# Patient Record
Sex: Male | Born: 1940 | Hispanic: No | Marital: Married | Smoking: Never smoker
Health system: Southern US, Community
[De-identification: ages and names within clinical notes are randomized; demographics above are authoritative.]

## PROBLEM LIST (undated history)

## (undated) DIAGNOSIS — R42 Dizziness and giddiness: Secondary | ICD-10-CM

## (undated) DIAGNOSIS — R519 Headache, unspecified: Secondary | ICD-10-CM

## (undated) DIAGNOSIS — R5383 Other fatigue: Secondary | ICD-10-CM

## (undated) DIAGNOSIS — I639 Cerebral infarction, unspecified: Secondary | ICD-10-CM

## (undated) DIAGNOSIS — R32 Unspecified urinary incontinence: Secondary | ICD-10-CM

## (undated) DIAGNOSIS — E785 Hyperlipidemia, unspecified: Secondary | ICD-10-CM

## (undated) DIAGNOSIS — Z9109 Other allergy status, other than to drugs and biological substances: Secondary | ICD-10-CM

## (undated) DIAGNOSIS — R0602 Shortness of breath: Secondary | ICD-10-CM

## (undated) DIAGNOSIS — J449 Chronic obstructive pulmonary disease, unspecified: Secondary | ICD-10-CM

## (undated) DIAGNOSIS — R296 Repeated falls: Secondary | ICD-10-CM

## (undated) DIAGNOSIS — J45909 Unspecified asthma, uncomplicated: Secondary | ICD-10-CM

## (undated) DIAGNOSIS — I10 Essential (primary) hypertension: Secondary | ICD-10-CM

## (undated) DIAGNOSIS — H538 Other visual disturbances: Secondary | ICD-10-CM

## (undated) DIAGNOSIS — R410 Disorientation, unspecified: Secondary | ICD-10-CM

## (undated) DIAGNOSIS — E119 Type 2 diabetes mellitus without complications: Secondary | ICD-10-CM

## (undated) DIAGNOSIS — R413 Other amnesia: Secondary | ICD-10-CM

## (undated) DIAGNOSIS — F32A Depression, unspecified: Secondary | ICD-10-CM

## (undated) DIAGNOSIS — F329 Major depressive disorder, single episode, unspecified: Secondary | ICD-10-CM

## (undated) DIAGNOSIS — R51 Headache: Secondary | ICD-10-CM

## (undated) HISTORY — DX: Type 2 diabetes mellitus without complications: E11.9

## (undated) HISTORY — DX: Hyperlipidemia, unspecified: E78.5

## (undated) HISTORY — DX: Repeated falls: R29.6

## (undated) HISTORY — DX: Other visual disturbances: H53.8

## (undated) HISTORY — DX: Major depressive disorder, single episode, unspecified: F32.9

## (undated) HISTORY — DX: Depression, unspecified: F32.A

## (undated) HISTORY — DX: Dizziness and giddiness: R42

## (undated) HISTORY — DX: Headache, unspecified: R51.9

## (undated) HISTORY — DX: Other fatigue: R53.83

## (undated) HISTORY — DX: Other amnesia: R41.3

## (undated) HISTORY — DX: Shortness of breath: R06.02

## (undated) HISTORY — DX: Disorientation, unspecified: R41.0

## (undated) HISTORY — DX: Unspecified asthma, uncomplicated: J45.909

## (undated) HISTORY — DX: Unspecified urinary incontinence: R32

## (undated) HISTORY — DX: Chronic obstructive pulmonary disease, unspecified: J44.9

## (undated) HISTORY — DX: Other allergy status, other than to drugs and biological substances: Z91.09

## (undated) HISTORY — DX: Headache: R51

---

## 2001-02-05 ENCOUNTER — Inpatient Hospital Stay (HOSPITAL_COMMUNITY): Admission: EM | Admit: 2001-02-05 | Discharge: 2001-02-06 | Payer: Self-pay

## 2001-02-10 ENCOUNTER — Encounter: Admission: RE | Admit: 2001-02-10 | Discharge: 2001-02-10 | Payer: Self-pay | Admitting: Family Medicine

## 2004-09-21 ENCOUNTER — Ambulatory Visit: Payer: Self-pay | Admitting: Cardiology

## 2004-10-05 ENCOUNTER — Ambulatory Visit: Payer: Self-pay

## 2005-05-17 ENCOUNTER — Ambulatory Visit: Payer: Self-pay | Admitting: Cardiology

## 2005-05-28 ENCOUNTER — Ambulatory Visit: Payer: Self-pay | Admitting: Internal Medicine

## 2005-06-21 ENCOUNTER — Ambulatory Visit: Payer: Self-pay | Admitting: Internal Medicine

## 2006-09-20 ENCOUNTER — Emergency Department (HOSPITAL_COMMUNITY): Admission: EM | Admit: 2006-09-20 | Discharge: 2006-09-20 | Payer: Self-pay | Admitting: Emergency Medicine

## 2009-09-15 ENCOUNTER — Encounter: Admission: RE | Admit: 2009-09-15 | Discharge: 2009-09-15 | Payer: Self-pay | Admitting: Cardiovascular Disease

## 2010-02-23 ENCOUNTER — Ambulatory Visit (HOSPITAL_COMMUNITY)
Admission: RE | Admit: 2010-02-23 | Discharge: 2010-02-23 | Payer: Self-pay | Source: Home / Self Care | Admitting: Family Medicine

## 2010-06-14 DIAGNOSIS — I639 Cerebral infarction, unspecified: Secondary | ICD-10-CM

## 2010-06-14 HISTORY — DX: Cerebral infarction, unspecified: I63.9

## 2010-07-02 ENCOUNTER — Inpatient Hospital Stay (HOSPITAL_COMMUNITY)
Admission: EM | Admit: 2010-07-02 | Discharge: 2010-07-04 | Payer: Self-pay | Source: Home / Self Care | Attending: Neurology | Admitting: Neurology

## 2010-07-03 ENCOUNTER — Encounter (INDEPENDENT_AMBULATORY_CARE_PROVIDER_SITE_OTHER): Payer: Self-pay | Admitting: Neurology

## 2010-07-04 ENCOUNTER — Encounter (INDEPENDENT_AMBULATORY_CARE_PROVIDER_SITE_OTHER): Payer: Self-pay | Admitting: Neurology

## 2010-09-24 LAB — CBC
HCT: 46.4 % (ref 39.0–52.0)
Platelets: 267 10*3/uL (ref 150–400)
RBC: 5.06 MIL/uL (ref 4.22–5.81)
WBC: 9.3 10*3/uL (ref 4.0–10.5)

## 2010-09-24 LAB — COMPREHENSIVE METABOLIC PANEL
BUN: 15 mg/dL (ref 6–23)
CO2: 24 mEq/L (ref 19–32)
Creatinine, Ser: 1 mg/dL (ref 0.4–1.5)
Glucose, Bld: 114 mg/dL — ABNORMAL HIGH (ref 70–99)
Total Bilirubin: 0.5 mg/dL (ref 0.3–1.2)

## 2010-09-24 LAB — DIFFERENTIAL
Basophils Absolute: 0 10*3/uL (ref 0.0–0.1)
Eosinophils Relative: 2 % (ref 0–5)
Lymphs Abs: 2.5 10*3/uL (ref 0.7–4.0)
Monocytes Relative: 11 % (ref 3–12)
Neutro Abs: 5.6 10*3/uL (ref 1.7–7.7)
Neutrophils Relative %: 61 % (ref 43–77)

## 2010-09-24 LAB — OSMOLALITY: Osmolality: 285 mOsm/kg (ref 275–300)

## 2010-09-24 LAB — HEMOGLOBIN A1C
Hgb A1c MFr Bld: 6.1 % — ABNORMAL HIGH (ref <5.7)
Mean Plasma Glucose: 128 mg/dL — ABNORMAL HIGH (ref <117)

## 2010-09-24 LAB — BLOOD GAS, ARTERIAL: FIO2: 21 %

## 2010-09-24 LAB — GLUCOSE, CAPILLARY
Glucose-Capillary: 110 mg/dL — ABNORMAL HIGH (ref 70–99)
Glucose-Capillary: 125 mg/dL — ABNORMAL HIGH (ref 70–99)
Glucose-Capillary: 95 mg/dL (ref 70–99)
Glucose-Capillary: 98 mg/dL (ref 70–99)

## 2010-09-24 LAB — URINALYSIS, ROUTINE W REFLEX MICROSCOPIC
Bilirubin Urine: NEGATIVE
Glucose, UA: NEGATIVE mg/dL
Ketones, ur: NEGATIVE mg/dL
Urobilinogen, UA: 0.2 mg/dL (ref 0.0–1.0)
pH: 6 (ref 5.0–8.0)

## 2010-09-24 LAB — LIPID PANEL
HDL: 30 mg/dL — ABNORMAL LOW (ref >39)
Total CHOL/HDL Ratio: 5.9 RATIO

## 2010-09-24 LAB — CK TOTAL AND CKMB (NOT AT ARMC)
CK, MB: 4.4 ng/mL — ABNORMAL HIGH (ref 0.3–4.0)
Relative Index: 3.1 — ABNORMAL HIGH (ref 0.0–2.5)
Total CK: 140 U/L (ref 7–232)

## 2010-09-24 LAB — ETHANOL: Alcohol, Ethyl (B): <5 mg/dL (ref 0–10)

## 2010-11-30 NOTE — Discharge Summary (Signed)
[place]. [facility]  Patient:    Kurt Lewis, Kurt Lewis                              MRN: [id number] Adm. Date:  [id number] Disc. Date: [id number] Attending:  Tobin Chad Dictator:   Arlis Porta, M.D. CC:         Viviann Spare A. Cleta Alberts, M.D., [facility]   Discharge Summary  DISCHARGE DIAGNOSIS:  Congestive heart failure.  SECONDARY DIAGNOSES: 1. Chronic obstructive pulmonary disease. 2. Hypertension.  PROCEDURES:  The patient had a 2-D echocardiogram done on February 06, 2001, which showed:  1. Left ventricular systolic function at the lower limits of normal with an ejection fraction of 50-55%.  2. Left ventricular wall increase in thickness.  3. Aortic valve increase in thickness.  4. Mild aortic root dilatation.  5. Left ventricular mild to moderate dilatation.  6. Right atrial mild to moderate dilatation.  CONSULTS:  There were no consultations done during his stay.  HISTORY OF PRESENT ILLNESS:  [name] is a 70 year old [place] gentleman who presented to the ER with complaints of fever, coughing, and shortness of breath off and on for the past several years.  On chest x-ray, the patient had some pleural fluid which was unchanged from one month prior when he was seen in the Urgent Care Clinic by Viviann Spare A. Cleta Alberts, M.D.  The patient was then admitted to rule out any myocardial infarction by enzymes, as well as to evaluate his cardiac function by 2-D echocardiogram with the results shown above.  HOSPITAL COURSE:  The patient was treated with IV Lasix with a good diuresis of around 4 L and a decrease in weight of 7-1/2 pounds.  By morning, the patients clinical condition had improved with only having mild crackles at the bases of his lung on auscultation.  The patient had good O2 saturations at 94% on room air.  The patient ruled out for myocardial infarction by enzymes x 2.  It was decided that the patient would benefit from an ACE  inhibitor and hydrochlorothiazide combination pill for treatment of his longstanding anemia and congestive heart failure.  The patient would also likely benefit from a spironolactone as well.  The patient would also benefit from spirometry studies to categorize his COPD.  However, all of this can be done as an outpatient with Viviann Spare A. Cleta Alberts, M.D., at the Urgent Care [facility] on [address]  ACTIVITY INSTRUCTIONS:  The patient can maintain activity as tolerated.  DIET INSTRUCTIONS:  The patient was told to maintain a low-sodium diet, eating less than 2 g of salt per day.  DISCHARGE MEDICATIONS:  The patient was sent home on Uniretic 12.5 mg to 7.5 mg tablets to take one tablet p.o. q.d. one hour before meals, #30, no refills.  FOLLOW-UP PLAN:  The patient is to call Viviann Spare A. Cleta Alberts, M.D., and see him early next week at the Urgent Care [facility] on [address]. The phone number is [phone].  SPECIAL INSTRUCTIONS:  The patient was told to return to the hospital for worsening chest pain and shortness of breath that was not relieved by rest. DD:  02/06/01 TD:  02/07/01 Job: [id number]

## 2010-11-30 NOTE — H&P (Signed)
[name]. [facility]  Patient:    Kurt Lewis, Kurt Lewis                              MRN: [id number] Adm. Date:  [id number] Disc. Date: [id number] Attending:  Tobin Chad Dictator:   Arlis Porta, M.D.                         History and Physical  CHIEF COMPLAINT: Cough, shortness of breath, and fever.  HISTORY OF PRESENT ILLNESS: [name] is a 70 year old [place] gentleman with complaints of cough and shortness of breath.  The patient is not able to relate much history as he speaks very little [place] and his daughter is the only one that can translate.  The patient admits to some fever off and on. Overall, the patient has had these complaints for several years it seems like. The patient was seen in [facility] by Dr. Shea Evans at the end of June 2002, which showed some pleural edema on chest x-ray.  He was told to follow up in the same clinic one month later, at which time another chest x-ray was obtained which did not show any resolution of his pleural fluid.  He was then sent to the ER for evaluation and work-up.  The patient was then therefore admitted for treatment of congestive heart failure.  REVIEW OF SYSTEMS: The patient denies having any nausea, vomiting, or abdominal pain.  The patient also does have some chest pain that is worse with breathing.  PAST MEDICAL HISTORY: COPD.  MEDICATIONS: Advair Diskus.  ALLERGIES: No known drug allergies.  FAMILY HISTORY: The patient has two daughter, ages 58 and 40, and two sons, ages 77 and 4, who are all healthy.  The rest of the family history was unobtainable.  SOCIAL HISTORY: The patient is a former smoker at one pack per day x 60 years. However, the patient quit smoking three to four years ago.  The patient does admit to occasional alcohol use, maybe one time per month.  The patient works at [employer].  The patients family has been in the [place] for only five years.  At some  point the patient was a prisoner of war.  PHYSICAL EXAMINATION:  VITAL SIGNS: Temperature 96.6 degrees, heart rate 63, blood pressure 178/95, respiratory rate 20.  Oxygen saturation 98% on room air.  Weight 162 pounds on admission.  GENERAL: The patient was not in acute distress and was well-appearing.  HEENT: PERRL.  EOMI.  Oropharynx not erythematous, without exudate.  NECK: Soft and supple without any lymphadenopathy or bruits.  The patient did have some JVD.  CHEST: On lung examination the patient had bibasilar crackles up to a third of his lung base, otherwise clear.  The patient did have some good air movement without any use of accessory muscles.  HEART: Regular rate and rhythm without no murmurs, rubs, or gallops.  ABDOMEN: Belly soft, nontender, nondistended, positive bowel sounds.  No signs of hepatosplenomegaly.  EXTREMITIES: Trace edema of the legs, 2+ pulses distally at the radial arteries and dorsalis pedis arteries.  NEUROLOGIC: Grossly intact with no focal abnormalities.  LABORATORY DATA: CBC showed a WBC of 7.7, hemoglobin 14.6, hematocrit 42.2, platelet count 283,000; MCV 85.7.  BMP shows sodium 139, potassium 3.6, chloride 107, CO2 30, BUN 17, creatinine 1.0, glucose 99, calcium 8.7.  LFTs showed AST 27, ALT  21, alkaline phosphatase 66, total bilirubin 0.7, total protein 8.1, albumin 3.8.  Initial cardiac enzymes drawn showed a total CK of 214, MB fraction 3.5, troponin 0.01.  EKG showed T wave inversion in leads V4-V5, which was new compared to EKG done in June 2002.  Chest x-ray shows positive pulmonary edema, tortuous aorta, and cardiomegaly.  ASSESSMENT/PLAN: This is a 70 year old [place] male with shortness of breath, cough, and edema.  1. Congestive heart failure.  The patient shows signs of pulmonary edema     by chest x-ray and crackles on examination, diagnosis also consistent with     his edema of his lower extremities.  The patient will be  given 80 mg IV     x 1 in the emergency department and will be re-evaluated every couple of     hours.  The patient will be scheduled for a 2D echocardiogram in the     morning.  A repeat chest x-ray will also be done in the morning.  The     patient will be further ruled out for any myocardial infarction by     enzymes x 3.  Ins and outs will be followed strictly with daily weights     following.  The patient will likely benefit from an ACE inhibitor and     long-term Aldactone use as an outpatient.  2. Hypertension.  The patients high blood pressure is likely related to his     volume overload and will benefit from diuresis as above in #1.  His blood     pressure will be re-evaluated once he has shown good diuresis.  Again, the     patient would benefit from ACE inhibitor as an outpatient for his blood     pressure control.  3. Chronic obstructive pulmonary disease.  The patient likely needs     spirometry as an outpatient to categorize his COPD given his extensive     tobacco use history. DD:  02/06/01 TD:  02/07/01 Job: [id number]

## 2013-02-25 ENCOUNTER — Inpatient Hospital Stay (HOSPITAL_COMMUNITY)
Admission: EM | Admit: 2013-02-25 | Discharge: 2013-03-01 | DRG: 065 | Disposition: A | Discharge status: Home Health | Payer: Medicare Other | Attending: Neurology | Admitting: Neurology

## 2013-02-25 ENCOUNTER — Emergency Department (HOSPITAL_COMMUNITY): Payer: Medicare Other

## 2013-02-25 ENCOUNTER — Encounter (HOSPITAL_COMMUNITY): Payer: Self-pay | Admitting: *Deleted

## 2013-02-25 DIAGNOSIS — R519 Headache, unspecified: Secondary | ICD-10-CM | POA: Diagnosis present

## 2013-02-25 DIAGNOSIS — I10 Essential (primary) hypertension: Secondary | ICD-10-CM | POA: Diagnosis present

## 2013-02-25 DIAGNOSIS — F3289 Other specified depressive episodes: Secondary | ICD-10-CM | POA: Diagnosis present

## 2013-02-25 DIAGNOSIS — F329 Major depressive disorder, single episode, unspecified: Secondary | ICD-10-CM | POA: Diagnosis present

## 2013-02-25 DIAGNOSIS — R51 Headache: Secondary | ICD-10-CM | POA: Diagnosis present

## 2013-02-25 DIAGNOSIS — Z79899 Other long term (current) drug therapy: Secondary | ICD-10-CM

## 2013-02-25 DIAGNOSIS — E871 Hypo-osmolality and hyponatremia: Secondary | ICD-10-CM | POA: Diagnosis not present

## 2013-02-25 DIAGNOSIS — I610 Nontraumatic intracerebral hemorrhage in hemisphere, subcortical: Secondary | ICD-10-CM | POA: Diagnosis present

## 2013-02-25 DIAGNOSIS — J45909 Unspecified asthma, uncomplicated: Secondary | ICD-10-CM | POA: Diagnosis present

## 2013-02-25 DIAGNOSIS — I69992 Facial weakness following unspecified cerebrovascular disease: Secondary | ICD-10-CM

## 2013-02-25 DIAGNOSIS — E785 Hyperlipidemia, unspecified: Secondary | ICD-10-CM | POA: Diagnosis present

## 2013-02-25 DIAGNOSIS — R410 Disorientation, unspecified: Secondary | ICD-10-CM | POA: Diagnosis present

## 2013-02-25 DIAGNOSIS — I629 Nontraumatic intracranial hemorrhage, unspecified: Secondary | ICD-10-CM | POA: Diagnosis not present

## 2013-02-25 DIAGNOSIS — I619 Nontraumatic intracerebral hemorrhage, unspecified: Principal | ICD-10-CM | POA: Diagnosis present

## 2013-02-25 DIAGNOSIS — Z9114 Patient's other noncompliance with medication regimen: Secondary | ICD-10-CM

## 2013-02-25 DIAGNOSIS — Z91199 Patient's noncompliance with other medical treatment and regimen due to unspecified reason: Secondary | ICD-10-CM

## 2013-02-25 DIAGNOSIS — Z9119 Patient's noncompliance with other medical treatment and regimen: Secondary | ICD-10-CM

## 2013-02-25 DIAGNOSIS — F05 Delirium due to known physiological condition: Secondary | ICD-10-CM | POA: Diagnosis present

## 2013-02-25 HISTORY — DX: Cerebral infarction, unspecified: I63.9

## 2013-02-25 HISTORY — DX: Essential (primary) hypertension: I10

## 2013-02-25 LAB — COMPREHENSIVE METABOLIC PANEL
ALT: 20 U/L (ref 0–53)
Alkaline Phosphatase: 59 U/L (ref 39–117)
BUN: 16 mg/dL (ref 6–23)
CO2: 26 mEq/L (ref 19–32)
Calcium: 9 mg/dL (ref 8.4–10.5)
GFR calc Af Amer: >90 mL/min (ref >90)
GFR calc non Af Amer: 83 mL/min — ABNORMAL LOW (ref >90)
Glucose, Bld: 96 mg/dL (ref 70–99)
Sodium: 136 mEq/L (ref 135–145)

## 2013-02-25 LAB — MRSA PCR SCREENING: MRSA by PCR: NEGATIVE

## 2013-02-25 LAB — CBC WITH DIFFERENTIAL/PLATELET
Eosinophils Absolute: 0.1 10*3/uL (ref 0.0–0.7)
Eosinophils Relative: 1 % (ref 0–5)
HCT: 45.7 % (ref 39.0–52.0)
Hemoglobin: 15.5 g/dL (ref 13.0–17.0)
Lymphocytes Relative: 25 % (ref 12–46)
Lymphs Abs: 2.2 10*3/uL (ref 0.7–4.0)
MCH: 30.4 pg (ref 26.0–34.0)
MCV: 89.6 fL (ref 78.0–100.0)
Monocytes Relative: 8 % (ref 3–12)
Platelets: 263 10*3/uL (ref 150–400)
RBC: 5.1 MIL/uL (ref 4.22–5.81)
WBC: 8.8 10*3/uL (ref 4.0–10.5)

## 2013-02-25 LAB — URINALYSIS, ROUTINE W REFLEX MICROSCOPIC
Bilirubin Urine: NEGATIVE
Glucose, UA: NEGATIVE mg/dL
Ketones, ur: NEGATIVE mg/dL
Protein, ur: NEGATIVE mg/dL
pH: 7 (ref 5.0–8.0)

## 2013-02-25 LAB — GLUCOSE, CAPILLARY: Glucose-Capillary: 91 mg/dL (ref 70–99)

## 2013-02-25 MED ORDER — ACETAMINOPHEN 650 MG RE SUPP: 650.0000 mg | RECTAL | Status: DC | PRN

## 2013-02-25 MED ORDER — LABETALOL HCL 5 MG/ML IV SOLN
10.0000 mg | INTRAVENOUS | Status: DC | PRN
  Administered 2013-02-25: 20 mg via INTRAVENOUS
  Administered 2013-02-27: 10 mg via INTRAVENOUS
  Administered 2013-02-27: 20 mg via INTRAVENOUS
  Filled 2013-02-25 (×3): qty 4

## 2013-02-25 MED ORDER — ACETAMINOPHEN 325 MG PO TABS
650.0000 mg | ORAL_TABLET | ORAL | Status: DC | PRN
  Administered 2013-02-26 – 2013-03-01 (×10): 650 mg via ORAL
  Filled 2013-02-25 (×8): qty 2
  Filled 2013-02-25: qty 1
  Filled 2013-02-25: qty 2

## 2013-02-25 MED ORDER — SENNOSIDES-DOCUSATE SODIUM 8.6-50 MG PO TABS
1.0000 | ORAL_TABLET | Freq: Two times a day (BID) | ORAL | Status: DC
  Administered 2013-02-26 – 2013-02-27 (×3): 1 via ORAL
  Filled 2013-02-25 (×5): qty 1

## 2013-02-25 MED ORDER — PANTOPRAZOLE SODIUM 40 MG IV SOLR
40.0000 mg | Freq: Every day | INTRAVENOUS | Status: DC
  Administered 2013-02-25 – 2013-02-26 (×2): 40 mg via INTRAVENOUS
  Filled 2013-02-25 (×3): qty 40

## 2013-02-25 MED ORDER — SODIUM CHLORIDE 0.9 % IV SOLN
INTRAVENOUS | Status: DC
  Administered 2013-02-25 – 2013-02-26 (×2): via INTRAVENOUS
  Administered 2013-02-27: 1000 mL via INTRAVENOUS

## 2013-02-25 NOTE — Consult Note (Signed)
Referring Physician: ED    Chief Complaint: Confusion, HA, cerebral hemorrhage on CT brain  HPI:                                                                                                                                         Kurt Lewis is an 72 y.o. 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 Patient doesn't speak English and thus his daughter helps to gather all clinical information. He was in his usual state of health until 3 days ago when he was called from [place] to inform him that his granddaughter died unexpectedly and ever since he has been acting confused at home and complaining of HA.   For instance, his daughter reports that Kurt Lewis will get up in the middle of the night to cook and when asked why he is doing that he will not be able to provide a rationale explanation and instead will continue acting confused. At the same time, he has been bothered by daily HA that are not associated with nausea, vomiting, vertigo, double vision, difficulty speaking, slurred speech, language or vision impairment. Importantly, his daughter told me that her father hasn't been taking his BP medication for a long time. No use of anticoagulants or antiplatelets. No recent head trauma or falls. Upon arrival to ED he had a CT brain that disclosed an acute 2.9 x 1.6 cm hematoma centered along the medial aspect of the right thalamus causing local mass effect upon the right lateral ventricle. No intraventricular extension.   Date last known well: uncertain Time last known well: uncertain tPA Given: no, ICH NIHSS: 1 (old deficit right face weakness) MRS: 0   Past Medical History  Diagnosis Date  . Hypertension   . Stroke     History reviewed. No pertinent past surgical history.  History reviewed. No pertinent family history. Social History:  reports that he does not drink  alcohol. His tobacco and drug histories are not on file.  Allergies: No Known Allergies  Medications:                                                                                                                           I have reviewed the patient's current medications.  ROS:                                                                                                                                       History obtained from the patient via daughter's translation, chart review.  General ROS: negative for - chills, fatigue, fever, night sweats, weight gain or weight loss Psychological ROS: negative for - behavioral disorder, hallucinations, memory difficulties, mood swings or suicidal ideation Ophthalmic ROS: negative for - blurry vision, double vision, eye pain or loss of vision ENT ROS: negative for - epistaxis, nasal discharge, oral lesions, sore throat, tinnitus or vertigo Allergy and Immunology ROS: negative for - hives or itchy/watery eyes Hematological and Lymphatic ROS: negative for - bleeding problems, bruising or swollen lymph nodes Endocrine ROS: negative for - galactorrhea, hair pattern changes, polydipsia/polyuria or temperature intolerance Respiratory ROS: negative for - cough, hemoptysis, shortness of breath or wheezing Cardiovascular ROS: negative for - chest pain, dyspnea on exertion, edema or irregular heartbeat Gastrointestinal ROS: negative for - abdominal pain, diarrhea, hematemesis, nausea/vomiting or stool incontinence Genito-Urinary ROS: negative for - dysuria, hematuria, incontinence or urinary frequency/urgency Musculoskeletal ROS: negative for - joint swelling or muscular weakness Neurological ROS: as noted in HPI Dermatological ROS: negative for rash and skin lesion changes    Physical exam: pleasant male in no apparent distress.Blood pressure 172/80, pulse 73, temperature 99.1 F (37.3 C), temperature source Oral, resp. rate 20, SpO2 96.00%.  Head:  normocephalic. Neck: supple, no bruits, no JVD. Cardiac: no murmurs. Lungs: clear. Abdomen: soft, no tender, no mass. Extremities: no edema.  Neurologic Examination:                                                                                                      Mental Status: Alert, awake, oriented x 4, thought content appropriate.  Speech fluent without evidence of aphasia.  Able to follow 3 step commands without difficulty. Cranial Nerves: II: Discs flat bilaterally; Visual fields grossly normal, pupils equal, round, reactive to light and accommodation III,IV, VI: ptosis not present, extra-ocular motions intact bilaterally V: facial light touch sensation normal bilaterally VII: smile symmetric, VIII: hearing normal bilaterally IX,X: gag reflex present XI: bilateral shoulder shrug XII: midline tongue extension Motor: Right : Upper extremity   5/5    Left:     Upper extremity   5/5  Lower extremity   5/5     Lower extremity   5/5 Tone and bulk:normal tone throughout; no atrophy noted Sensory: Pinprick  and light touch intact throughout, bilaterally Deep Tendon Reflexes:  1+ all over  Plantars: Right: downgoing   Left: downgoing Cerebellar: normal finger-to-nose,  normal heel-to-shin test Gait:  No ataxia. CV: pulses palpable throughout     Results for orders placed during the hospital encounter of 02/25/13 (from the past 48 hour(s))  CBC WITH DIFFERENTIAL     Status: None   Collection Time    02/25/13 10:55 AM      Result Value Range   WBC 8.8  4.0 - 10.5 K/uL   RBC 5.10  4.22 - 5.81 MIL/uL   Hemoglobin 15.5  13.0 - 17.0 g/dL   HCT 16.1  09.6 - 04.5 %   MCV 89.6  78.0 - 100.0 fL   MCH 30.4  26.0 - 34.0 pg   MCHC 33.9  30.0 - 36.0 g/dL   RDW 40.9  81.1 - 91.4 %   Platelets 263  150 - 400 K/uL   Neutrophils Relative % 66  43 - 77 %   Neutro Abs 5.8  1.7 - 7.7 K/uL   Lymphocytes Relative 25  12 - 46 %   Lymphs Abs 2.2  0.7 - 4.0 K/uL   Monocytes Relative 8  3 -  12 %   Monocytes Absolute 0.7  0.1 - 1.0 K/uL   Eosinophils Relative 1  0 - 5 %   Eosinophils Absolute 0.1  0.0 - 0.7 K/uL   Basophils Relative 0  0 - 1 %   Basophils Absolute 0.0  0.0 - 0.1 K/uL  COMPREHENSIVE METABOLIC PANEL     Status: Abnormal   Collection Time    02/25/13 10:55 AM      Result Value Range   Sodium 136  135 - 145 mEq/L   Potassium 4.0  3.5 - 5.1 mEq/L   Chloride 101  96 - 112 mEq/L   CO2 26  19 - 32 mEq/L   Glucose, Bld 96  70 - 99 mg/dL   BUN 16  6 - 23 mg/dL   Creatinine, Ser 7.82  0.50 - 1.35 mg/dL   Calcium 9.0  8.4 - 95.6 mg/dL   Total Protein 7.4  6.0 - 8.3 g/dL   Albumin 3.6  3.5 - 5.2 g/dL   AST 22  0 - 37 U/L   ALT 20  0 - 53 U/L   Alkaline Phosphatase 59  39 - 117 U/L   Total Bilirubin 1.1  0.3 - 1.2 mg/dL   GFR calc non Af Amer 83 (*) >90 mL/min   GFR calc Af Amer >90  >90 mL/min   Comment: (NOTE)     The eGFR has been calculated using the CKD EPI equation.     This calculation has not been validated in all clinical situations.     eGFR's persistently <90 mL/min signify possible Chronic Kidney     Disease.  GLUCOSE, CAPILLARY     Status: None   Collection Time    02/25/13 11:06 AM      Result Value Range   Glucose-Capillary 91  70 - 99 mg/dL   Comment 1 Notify RN     Comment 2 Documented in Chart    URINALYSIS, ROUTINE W REFLEX MICROSCOPIC     Status: None   Collection Time    02/25/13 11:23 AM      Result Value Range   Color, Urine YELLOW  YELLOW   APPearance CLEAR  CLEAR   Specific Gravity, Urine 1.016  1.005 -  1.030   pH 7.0  5.0 - 8.0   Glucose, UA NEGATIVE  NEGATIVE mg/dL   Hgb urine dipstick NEGATIVE  NEGATIVE   Bilirubin Urine NEGATIVE  NEGATIVE   Ketones, ur NEGATIVE  NEGATIVE mg/dL   Protein, ur NEGATIVE  NEGATIVE mg/dL   Urobilinogen, UA 1.0  0.0 - 1.0 mg/dL   Nitrite NEGATIVE  NEGATIVE   Leukocytes, UA NEGATIVE  NEGATIVE   Comment: MICROSCOPIC NOT DONE ON URINES WITH NEGATIVE PROTEIN, BLOOD, LEUKOCYTES, NITRITE, OR  GLUCOSE <1000 mg/dL.  POCT I-STAT TROPONIN I     Status: None   Collection Time    02/25/13 11:24 AM      Result Value Range   Troponin i, poc 0.01  0.00 - 0.08 ng/mL   Comment 3            Comment: Due to the release kinetics of cTnI,     a negative result within the first hours     of the onset of symptoms does not rule out     myocardial infarction with certainty.     If myocardial infarction is still suspected,     repeat the test at appropriate intervals.   Ct Head Wo Contrast  02/25/2013   *RADIOLOGY REPORT*  Clinical Data: Severe headaches.  CT HEAD WITHOUT CONTRAST  Technique:  Contiguous axial images were obtained from the base of the skull through the vertex without contrast.  Comparison: 07/03/2010 CT and 07/02/2010 MR.  Findings: Acute to 2.9 x 1.6 cm hematoma centered along the medial aspect of the right thalamus and is causing local mass effect upon the right lateral ventricle without breakthrough into the ventricular system at the current time.  Cause of hemorrhage is indeterminate. This may be related to result of hypertensive bleed.  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 Scattered small cavernomas not entirely excluded.  Currently no hydrocephalus.  No CT evidence of acute thrombotic infarct or intracranial mass lesion separate from the above described findings.  IMPRESSION: Acute to 2.9 x 1.6 cm hematoma centered along the medial aspect of the right thalamus and is causing local mass effect upon the right lateral ventricle.  Please see above.  Critical Value/emergent results were called by telephone at the time of interpretation on 02/25/2013 at 12:34 p.m. to [facility] physician's assistant, who verbally acknowledged these results.   Original Report Authenticated By: Lacy Duverney, M.D.      Assessment: 72 y.o. 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 Mental status is intact and he has no lateralizing findings on exam. Will admit to the stroke service for symptomatic management. Follow CT brain in am. Maintain SBP<160. Frequent neuro-checks.  Stroke Risk Factors - HTN   Wyatt Portela, MD [facility] [phone]  02/25/2013, 1:28 PM

## 2013-02-25 NOTE — Progress Notes (Signed)
Orders per Dr. Leroy Kennedy to hold off on RN stroke swallow screen due to pt. Being npo.  Will continue to monitor pt. And will re-evaluate.

## 2013-02-25 NOTE — ED Provider Notes (Signed)
Medical screening examination/treatment/procedure(s) were performed by non-physician practitioner and as supervising physician I was immediately available for consultation/collaboration.  ICH, VSS, neuro exam w/o deficit.  C/S Neuro w/ plan for admit.  Darlys Gales, MD 02/25/13 [phone]

## 2013-02-25 NOTE — ED Provider Notes (Signed)
CSN: [id number]     Arrival date & time 02/25/13  4098 History     First MD Initiated Contact with Patient 02/25/13 1046     Chief Complaint  Patient presents with  . Altered Mental Status   (Consider location/radiation/quality/duration/timing/severity/associated sxs/prior Treatment) HPI Comments: Patient is a 72 year old male with history of hemorrhagic stroke and hypertension who presents today with sudden onset of altered mental status and confusion. It began on Monday. At his baseline he lives at home with his wife children. He cooks and drives per his daughter. Monday he started becoming forgetful. He generally picks his son up from work, but Monday could not remember where his work was. On Tuesday he could not remember who his son was and brought a cat into the house stating it was his grandson. He still walks with some hesitation which he has done ever since his stroke "a few years ago". He has been complaining of a frontal headache since Tuesday. He has been receiving Tylenol from his daughter. He reports that the Tylenol helps his pain. He had a coughing spell yesterday, but no recent fevers, chills, nausea, vomiting, abdominal pain.  The history is provided by the patient and a relative. A language interpreter was used (family member).    Past Medical History  Diagnosis Date  . Hypertension   . Stroke    History reviewed. No pertinent past surgical history. History reviewed. No pertinent family history. History  Substance Use Topics  . Smoking status: Not on file  . Smokeless tobacco: Not on file  . Alcohol Use: No    Review of Systems  Unable to perform ROS: Mental status change  All other systems reviewed and are negative.    Allergies  Review of patient's allergies indicates no known allergies.  Home Medications   Current Outpatient Rx  Name  Route  Sig  Dispense  Refill  . acetaminophen (TYLENOL) 500 MG tablet   Oral   Take 1,000 mg by mouth daily as needed  (for headache).         . benzonatate (TESSALON) 100 MG capsule   Oral   Take 100 mg by mouth 3 (three) times daily as needed for cough.          BP 172/96  Pulse 77  Temp(Src) 100.5 F (38.1 C) (Oral)  Resp 18  SpO2 93% Physical Exam  Nursing note and vitals reviewed. Constitutional: He is oriented to person, place, and time. He appears well-developed and well-nourished. No distress.  HENT:  Head: Normocephalic and atraumatic.  Right Ear: External ear normal.  Left Ear: External ear normal.  Nose: Nose normal.  Eyes: Conjunctivae are normal.  Neck: Normal range of motion. No tracheal deviation present.  Cardiovascular: Normal rate, regular rhythm and normal heart sounds.   Pulmonary/Chest: Effort normal and breath sounds normal. No stridor.  Abdominal: Soft. He exhibits no distension. There is no tenderness.  Musculoskeletal: Normal range of motion.  Neurological: He is alert and oriented to person, place, and time. He has normal strength. No sensory deficit. Coordination normal.  Strength 5/5 in all extremities. No focal deficit. No pronator drift. No facial droop.   Skin: Skin is warm and dry. He is not diaphoretic.  Psychiatric: He has a normal mood and affect. His behavior is normal.    ED Course   Procedures (including critical care time)  12:52 PM Discussed case with Dr. Leroy Kennedy of neurology who will admit patient.    Labs Reviewed  COMPREHENSIVE METABOLIC PANEL - Abnormal; Notable for the following:    GFR calc non Af Amer 83 (*)    All other components within normal limits  CBC WITH DIFFERENTIAL  GLUCOSE, CAPILLARY  URINALYSIS, ROUTINE W REFLEX MICROSCOPIC  POCT I-STAT TROPONIN I   Ct Head Wo Contrast  02/25/2013   *RADIOLOGY REPORT*  Clinical Data: Severe headaches.  CT HEAD WITHOUT CONTRAST  Technique:  Contiguous axial images were obtained from the base of the skull through the vertex without contrast.  Comparison: 07/03/2010 CT and 07/02/2010 MR.   Findings: Acute to 2.9 x 1.6 cm hematoma centered along the medial aspect of the right thalamus and is causing local mass effect upon the right lateral ventricle without breakthrough into the ventricular system at the current time.  Cause of hemorrhage is indeterminate. This may be related to result of hypertensive bleed.  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 Scattered small cavernomas not entirely excluded.  Currently no hydrocephalus.  No CT evidence of acute thrombotic infarct or intracranial mass lesion separate from the above described findings.  IMPRESSION: Acute to 2.9 x 1.6 cm hematoma centered along the medial aspect of the right thalamus and is causing local mass effect upon the right lateral ventricle.  Please see above.  Critical Value/emergent results were called by telephone at the time of interpretation on 02/25/2013 at 12:34 p.m. to [facility] physician's assistant, who verbally acknowledged these results.   Original Report Authenticated By: Lacy Duverney, M.D.   1. Intracranial hemorrhage   2. Hypertension     MDM  Patient with hx of hemorhagic stroke who presents with increasing confusion. CT shows 2.9 x 1.6 cm hematoma centered along the medial aspect of the right thalamus. Discussed this case with Dr. Leroy Kennedy of Neurology who agrees to admit patient. Vital signs currently stable for transfer to the floor. Discussed plan with daughter who agrees. Dr. Redgie Grayer has evaluated the patient and agrees with plan.   Mora Bellman, PA-C 02/25/13 1346

## 2013-02-25 NOTE — ED Notes (Addendum)
TO ED for eval of confusion over the past wk. Family states pt is repeating daily activity. States he isn't recognizing his family. Pt ambulatory and cooperative. C/o HA since Monday

## 2013-02-26 ENCOUNTER — Other Ambulatory Visit (HOSPITAL_COMMUNITY): Payer: Medicare Other

## 2013-02-26 ENCOUNTER — Inpatient Hospital Stay (HOSPITAL_COMMUNITY): Payer: Medicare Other

## 2013-02-26 MED ORDER — ATORVASTATIN CALCIUM 10 MG PO TABS
10.0000 mg | ORAL_TABLET | Freq: Every day | ORAL | Status: DC
  Administered 2013-02-26 – 2013-02-28 (×3): 10 mg via ORAL
  Filled 2013-02-26 (×4): qty 1

## 2013-02-26 MED ORDER — LISINOPRIL 5 MG PO TABS
5.0000 mg | ORAL_TABLET | Freq: Every day | ORAL | Status: DC
  Administered 2013-02-26 – 2013-03-01 (×4): 5 mg via ORAL
  Filled 2013-02-26 (×4): qty 1

## 2013-02-26 NOTE — Progress Notes (Signed)
Stroke Team Progress Note  HISTORY Kurt Lewis is an 72 y.o. male with a past medical history significant for HTN, stroke few years ago with residual right face weakness, brought to [facility] ED for further evaluation of HA and confusion.  Patient doesn't speak English and thus his daughter helps to gather all clinical information.  He was in his usual state of health until 3 days ago when he was called from [place] to inform him that his granddaughter died unexpectedly and ever since he has been acting confused at home and complaining of HA.  For instance, his daughter reports that [name] will get up in the middle of the night to cook and when asked why he is doing that he will not be able to provide a rationale explanation and instead will continue acting confused. At the same time, he has been bothered by daily HA that are not associated with nausea, vomiting, vertigo, double vision, difficulty speaking, slurred speech, language or vision impairment.  Importantly, his daughter told me that her father hasn't been taking his BP medication for a long time.  No use of anticoagulants or antiplatelets. No recent head trauma or falls.  Upon arrival to ED he had a CT brain that disclosed an acute 2.9 x 1.6 cm hematoma centered along the medial aspect of the right thalamus causing local mass effect upon the right lateral ventricle. No intraventricular extension.   Patient was not a TPA candidate secondary to hemorrhage. He was admitted to the neuro ICU for further evaluation and treatment.  SUBJECTIVE He is lying in bed. Family feels that he is near baseline. Speech appears normal.   OBJECTIVE Most recent Vital Signs: Filed Vitals:   02/26/13 0200 02/26/13 0300 02/26/13 0400 02/26/13 0700  BP: 150/74 140/86 153/74 155/69  Pulse:    69  Temp:   98.5 F (36.9 C)   TempSrc:   Oral   Resp: 16 16 16 16   Height:      Weight:      SpO2: 96%  95% 95%   CBG (last 3)   Recent Labs  02/25/13 1106  GLUCAP 91     IV Fluid Intake:   . sodium chloride 75 mL/hr at 02/26/13 0600    MEDICATIONS  . pantoprazole (PROTONIX) IV  40 mg Intravenous QHS  . senna-docusate  1 tablet Oral BID   PRN:  acetaminophen, acetaminophen, labetalol  Diet:  NPO--await official swallow screen Activity:  Out of bed DVT Prophylaxis:  SCD  CLINICALLY SIGNIFICANT STUDIES Basic Metabolic Panel:  Recent Labs Lab 02/25/13 1055  NA 136  K 4.0  CL 101  CO2 26  GLUCOSE 96  BUN 16  CREATININE 0.92  CALCIUM 9.0   Liver Function Tests:  Recent Labs Lab 02/25/13 1055  AST 22  ALT 20  ALKPHOS 59  BILITOT 1.1  PROT 7.4  ALBUMIN 3.6   CBC:  Recent Labs Lab 02/25/13 1055  WBC 8.8  NEUTROABS 5.8  HGB 15.5  HCT 45.7  MCV 89.6  PLT 263   Coagulation: No results found for this basename: LABPROT, INR,  in the last 168 hours Cardiac Enzymes: No results found for this basename: CKTOTAL, CKMB, CKMBINDEX, TROPONINI,  in the last 168 hours Urinalysis:  Recent Labs Lab 02/25/13 1123  COLORURINE YELLOW  LABSPEC 1.016  PHURINE 7.0  GLUCOSEU NEGATIVE  HGBUR NEGATIVE  BILIRUBINUR NEGATIVE  KETONESUR NEGATIVE  PROTEINUR NEGATIVE  UROBILINOGEN 1.0  NITRITE NEGATIVE  LEUKOCYTESUR NEGATIVE   Lipid  Panel    Component Value Date/Time   CHOL  Value: 178        ATP III CLASSIFICATION:  <200     mg/dL   Desirable  409-811  mg/dL   Borderline High  >=914    mg/dL   High        78/29/5621 0312   TRIG 128 07/04/2010 0312   HDL 30* 07/04/2010 0312   CHOLHDL 5.9 07/04/2010 0312   VLDL 26 07/04/2010 0312   LDLCALC  Value: 122        Total Cholesterol/HDL:CHD Risk Coronary Heart Disease Risk Table                     Men   Women  1/2 Average Risk   3.4   3.3  Average Risk       5.0   4.4  2 X Average Risk   9.6   7.1  3 X Average Risk  23.4   11.0        Use the calculated Patient Ratio above and the CHD Risk Table to determine the patient's CHD Risk.        ATP III CLASSIFICATION (LDL):  <100     mg/dL   Optimal   308-657  mg/dL   Near or Above                    Optimal  130-159  mg/dL   Borderline  846-962  mg/dL   High  >952     mg/dL   Very High* 84/13/2440 0312   HgbA1C  Lab Results  Component Value Date   HGBA1C  Value: 6.1 (NOTE)                                                                       According to the ADA Clinical Practice Recommendations for 2011, when HbA1c is used as a screening test:   >=6.5%   Diagnostic of Diabetes Mellitus           (if abnormal result  is confirmed)  5.7-6.4%   Increased risk of developing Diabetes Mellitus  References:Diagnosis and Classification of Diabetes Mellitus,Diabetes Care,2011,34(Suppl 1):S62-S69 and Standards of Medical Care in         Diabetes - 2011,Diabetes NUUV,2536,64  (Suppl 1):S11-S61.* 07/03/2010    Urine Drug Screen:   No results found for this basename: labopia, cocainscrnur, labbenz, amphetmu, thcu, labbarb    Alcohol Level: No results found for this basename: ETH,  in the last 168 hours  Ct Head Wo Contrast 02/25/2013  Acute to 2.9 x 1.6 cm hematoma centered along the medial aspect of the right thalamus and is causing local mass effect upon the right lateral ventricle.  02/26/2013  Stable hematoma in the right thalamus since yesterday's examination, measured above.  2. Stable mass effect upon the third ventricle and shift of the midline to the left approximating 8 mm  MRI of the brain    MRA of the brain    2D Echocardiogram    Carotid Doppler    CXR    EKG  NSR  Therapy Recommendations   Physical Exam   Pleasant middle aged [place] male not in distress.Awake alert.  Afebrile. Head is nontraumatic. Neck is supple without bruit. Hearing is normal. Cardiac exam no murmur or gallop. Lungs are clear to auscultation. Distal pulses are well felt. Neurological Exam ;  Cognitive evaluation limited due to the language barrier. Patient's son speaks [place] and translates. Patient appears not to be able to recognize family members with  the names but can follow simple one-step commands. He does speak in his native language and does not appear to have dysarthria.. Mild left lower face asymmetry. Tongue midline. No drift. Mild diminished fine finger movements on left. Orbits right over left upper extremity. Mild left grip weak.. Normal sensation . Normal coordination. ;  ASSESSMENT Mr. Dequane Strahan is a 72 y.o. male presenting with headache and acute delirium. Imaging confirms an acute to 2.9 x 1.6 cm hematoma centered along the medial aspect of the right thalamus and is causing local mass effect upon the right lateral ventricle. Infarct felt to be hypertensive in nature. Patient has been on antihypertensives in past but had stopped them for no reason known. (Lisinopril and HCTZ)  On no antithrombotics prior to admission. Now on no antithrombotics for secondary stroke prevention. Patient with resultant transient confusion. Work up underway.   Hypertension  Non compliance  Hyperlipidemia, LDL 122, goal < 100, add statin.  Acute delirium, resolved  Right thalamic hemorrhage   Hospital day # 1  TREATMENT/PLAN  Continue no antithrombotics for secondary stroke prevention.  Repeat CT Scan today--no change  Add statin for hyperlipidemia  Start ACE for hypertension  Risk factor modification  Await therapy evaluations.  Gwendolyn Lima. Manson Passey, [facility], MBA, MHA [facility] Pager: [phone] 02/26/2013 10:18 AM  I have personally obtained a history, examined the patient, evaluated imaging results, and formulated the assessment and plan of care. I agree with the above. Delia Heady, MD

## 2013-02-26 NOTE — Evaluation (Signed)
Physical Therapy Evaluation Patient Details Name: Kurt Lewis MRN: [id number] DOB: 1940-12-25 Today's Date: 02/26/2013 Time: 9562-1308 PT Time Calculation (min): 14 min  PT Assessment / Plan / Recommendation History of Present Illness  He was in his usual state of health until 3 days ago when he was called from [place] to inform him that his granddaughter died unexpectedly and ever since he has been acting confused at home and complaining of HA.  CT brain that disclosed an acute 2.9 x 1.6 cm hematoma centered along the medial aspect of the right thalamus causing local mass effect upon the right lateral ventricle.  Clinical Impression  Pt admitted with above. Pt currently with functional limitations due to the deficits listed below (see PT Problem List).  Pt will benefit from skilled PT to increase their independence and safety with mobility to allow discharge to the venue listed below.       PT Assessment  Patient needs continued PT services    Follow Up Recommendations  Home health PT;Supervision/Assistance - 24 hour    Does the patient have the potential to tolerate intense rehabilitation      Barriers to Discharge        Equipment Recommendations  None recommended by PT    Recommendations for Other Services     Frequency Min 4X/week    Precautions / Restrictions Precautions Precautions: Fall   Pertinent Vitals/Pain VSS      Mobility  Bed Mobility Bed Mobility: Supine to Sit;Sitting - Scoot to Edge of Bed Supine to Sit: 5: Supervision;With rails;HOB elevated Sitting - Scoot to Edge of Bed: 5: Supervision Details for Bed Mobility Assistance: Incr time. Transfers Transfers: Sit to Stand;Stand to Sit Sit to Stand: 5: Supervision;With upper extremity assist;From bed Stand to Sit: 5: Supervision;With upper extremity assist;With armrests;To chair/3-in-1 Ambulation/Gait Ambulation/Gait Assistance: 4: Min guard Ambulation Distance (Feet): 150 Feet Assistive device:  None Ambulation/Gait Assistance Details: Pt without loss of balance.   Gait Pattern: Decreased step length - right;Decreased step length - left;Shuffle Gait velocity: decr    Exercises     PT Diagnosis: Difficulty walking  PT Problem List: Decreased balance;Decreased mobility;Decreased activity tolerance PT Treatment Interventions: Gait training;Functional mobility training;Therapeutic activities;Balance training;Patient/family education     PT Goals(Current goals can be found in the care plan section) Acute Rehab PT Goals Patient Stated Goal: Pt didn't state. PT Goal Formulation: Patient unable to participate in goal setting Time For Goal Achievement: 03/05/13 Potential to Achieve Goals: Good  Visit Information  Last PT Received On: 02/26/13 Assistance Needed: +1 History of Present Illness: He was in his usual state of health until 3 days ago when he was called from [place] to inform him that his granddaughter died unexpectedly and ever since he has been acting confused at home and complaining of HA.  CT brain that disclosed an acute 2.9 x 1.6 cm hematoma centered along the medial aspect of the right thalamus causing local mass effect upon the right lateral ventricle.       Prior Functioning  Home Living Family/patient expects to be discharged to:: Private residence Living Arrangements: Spouse/significant other Available Help at Discharge: Family Home Equipment: None Prior Function Level of Independence: Independent Communication Communication: Prefers language other than English    Cognition  Cognition Arousal/Alertness: Awake/alert Behavior During Therapy: Flat affect Overall Cognitive Status: Difficult to assess Difficult to assess due to: Non-English speaking    Extremity/Trunk Assessment Upper Extremity Assessment Upper Extremity Assessment: Defer to OT evaluation Lower Extremity Assessment Lower  Extremity Assessment: Overall WFL for tasks assessed   Balance  Balance Balance Assessed: Yes Static Standing Balance Static Standing - Balance Support: No upper extremity supported Static Standing - Level of Assistance: 5: Stand by assistance  End of Session PT - End of Session Activity Tolerance: Patient tolerated treatment well Patient left: in chair;with call bell/phone within reach;with family/visitor present Nurse Communication: Mobility status  GP     [facility] 02/26/2013, 2:37 PM  [facility] PT [phone]

## 2013-02-26 NOTE — Progress Notes (Signed)
Pt's daughter at bedside. She states that pt's speech does not sound slurred today. Pt is A,Ox4, but is slow to respond and the daughter confirmed that this is different than his baseline. Daughter also stated that residuals from his previous stroke include a R sided facial droop and shuffling of the right foot, but has no difficulty mobilizing at home.   Kurt Lewis

## 2013-02-26 NOTE — Evaluation (Signed)
Speech Language Pathology Evaluation Patient Details Name: Kurt Lewis MRN: [id number] DOB: 1941/01/29 Today's Date: 02/26/2013 Time: 1191-4782 SLP Time Calculation (min): 50 min  Problem List: There are no active problems to display for this patient.  Past Medical History:  Past Medical History  Diagnosis Date  . Hypertension   . Stroke    Past Surgical History: History reviewed. No pertinent past surgical history. HPI:  He was in his usual state of health until 3 days ago when he was called from [place] to inform him that his granddaughter died unexpectedly and ever since he has been acting confused at home and complaining of HA.  CT brain that disclosed an acute 2.9 x 1.6 cm hematoma centered along the medial aspect of the right thalamus causing local mass effect upon the right lateral ventricle.    Assessment / Plan / Recommendation Clinical Impression   Patient was assessed for speech-language and cognitive functioning with interpreter provided via interpreter phone service. Patient was oriented to time/place situation, stated that he had a h/o "sometimes forget", when asked about his memory. Patient exhibited a mild decreased processing speed when responding to open-ended questions, and decreased thought organization, and a suspected deficit in memory retrieval. Patient also exhibited a mostly flat affect, and suspected decreased emergent awareness, though unable to differentiate from baseline, secondary to patient's spouse present, but unable to accurately judge, and no other family members present at time of evaluation. Patient appears to exhibit a mild impairment of cognitive function, versus exacerbation of premorbid deficits. He will benefit from 24/7 supervision and assistance as needed from family for safety. Do not anticipate a significant benefit from speech-language pathology services at this time.    SLP Assessment  Patient does not need any further Speech Lanaguage Pathology  Services    Follow Up Recommendations  24 hour supervision/assistance  Patient stated that he has asthma, and HBP, and that both of his medications for these conditions, have run out "1-2 months ago".   Frequency and Duration        Pertinent Vitals/Pain    SLP Goals     SLP Evaluation Prior Functioning  Cognitive/Linguistic Baseline: Baseline deficits Baseline deficit details: Via interpreter, patient described that he had h/o "sometimes forget" Available Help at Discharge: Family Vocation: Retired   IT consultant  Overall Cognitive Status: Difficult to assess Arousal/Alertness: Awake/alert Orientation Level: Oriented X4 Attention: Sustained;Selective Sustained Attention: Appears intact Selective Attention: Impaired Selective Attention Impairment: Verbal complex Memory: Impaired Memory Impairment: Retrieval deficit Awareness: Impaired Awareness Impairment: Emergent impairment Problem Solving: Appears intact Executive Function: Reasoning Reasoning: Impaired Reasoning Impairment: Verbal complex Behaviors: Other (comment) (mostly flat affect) Safety/Judgment: Impaired    Comprehension  Auditory Comprehension Overall Auditory Comprehension: Appears within functional limits for tasks assessed Conversation: Complex Interfering Components: Processing speed;Attention EffectiveTechniques: Extra processing time;Repetition Visual Recognition/Discrimination Discrimination: Not tested Reading Comprehension Reading Status: Not tested    Expression Expression Primary Mode of Expression: Verbal (interpreter utilized via phone ) Verbal Expression Overall Verbal Expression: Appears within functional limits for tasks assessed   Oral / Motor Oral Motor/Sensory Function Overall Oral Motor/Sensory Function: Appears within functional limits for tasks assessed Motor Speech Overall Motor Speech: Appears within functional limits for tasks assessed   GO     Pablo Lawrence 02/26/2013, 3:56 PM

## 2013-02-26 NOTE — Progress Notes (Signed)
UR completed 

## 2013-02-27 ENCOUNTER — Inpatient Hospital Stay (HOSPITAL_COMMUNITY): Payer: Medicare Other

## 2013-02-27 DIAGNOSIS — R410 Disorientation, unspecified: Secondary | ICD-10-CM | POA: Diagnosis present

## 2013-02-27 DIAGNOSIS — E785 Hyperlipidemia, unspecified: Secondary | ICD-10-CM | POA: Diagnosis present

## 2013-02-27 DIAGNOSIS — Z91148 Patient's other noncompliance with medication regimen for other reason: Secondary | ICD-10-CM

## 2013-02-27 DIAGNOSIS — I629 Nontraumatic intracranial hemorrhage, unspecified: Secondary | ICD-10-CM | POA: Diagnosis not present

## 2013-02-27 DIAGNOSIS — J45909 Unspecified asthma, uncomplicated: Secondary | ICD-10-CM | POA: Diagnosis present

## 2013-02-27 DIAGNOSIS — Z9114 Patient's other noncompliance with medication regimen: Secondary | ICD-10-CM

## 2013-02-27 DIAGNOSIS — I10 Essential (primary) hypertension: Secondary | ICD-10-CM | POA: Diagnosis present

## 2013-02-27 MED ORDER — ATORVASTATIN CALCIUM 10 MG PO TABS: 10.0000 mg | ORAL_TABLET | Freq: Every day | ORAL | Status: AC

## 2013-02-27 MED ORDER — ALBUTEROL SULFATE HFA 108 (90 BASE) MCG/ACT IN AERS
2.0000 | INHALATION_SPRAY | Freq: Four times a day (QID) | RESPIRATORY_TRACT | Status: DC | PRN
  Filled 2013-02-27: qty 6.7

## 2013-02-27 MED ORDER — ONDANSETRON HCL 4 MG/2ML IJ SOLN
4.0000 mg | Freq: Once | INTRAMUSCULAR | Status: AC
  Administered 2013-02-27: 4 mg via INTRAVENOUS
  Filled 2013-02-27: qty 2

## 2013-02-27 MED ORDER — KETOROLAC TROMETHAMINE 15 MG/ML IJ SOLN
15.0000 mg | Freq: Three times a day (TID) | INTRAMUSCULAR | Status: DC
  Administered 2013-02-27: 15 mg via INTRAVENOUS
  Filled 2013-02-27 (×4): qty 1

## 2013-02-27 MED ORDER — PANTOPRAZOLE SODIUM 40 MG PO TBEC: 40.0000 mg | DELAYED_RELEASE_TABLET | Freq: Every day | ORAL | Status: DC

## 2013-02-27 MED ORDER — ALBUTEROL SULFATE HFA 108 (90 BASE) MCG/ACT IN AERS: 2.0000 | INHALATION_SPRAY | Freq: Four times a day (QID) | RESPIRATORY_TRACT | Status: AC | PRN

## 2013-02-27 MED ORDER — KETOROLAC TROMETHAMINE 30 MG/ML IJ SOLN
INTRAMUSCULAR | Status: AC
  Administered 2013-02-27: 30 mg
  Filled 2013-02-27: qty 1

## 2013-02-27 MED ORDER — LISINOPRIL 5 MG PO TABS: 5.0000 mg | ORAL_TABLET | Freq: Every day | ORAL | Status: DC

## 2013-02-27 NOTE — Progress Notes (Signed)
Stroke Team Progress Note  HISTORY Kurt Lewis is a 72 y.o. male with a past medical history significant for HTN, stroke few years ago with residual right face weakness, brought to  [facility] ED for further evaluation of HA and confusion on 02/25/2013. Patient doesn't speak English and thus his daughter helped to gather all clinical information.  He was in his usual state of health until 3 days prior to admission when he received a call from Kurt Lewis to inform him that his granddaughter died unexpectedly and ever since he had been acting confused at home and complaining of HA.  For instance, his daughter reported that Kurt Lewis would get up in the middle of the night to cook and when asked what he was doing he was not be able to provide a rationale explanation and instead would continue acting confused. At the same time, he had been bothered by daily HAs that were not associated with nausea, vomiting, vertigo, double vision, difficulty speaking, slurred speech, language or vision impairment. Importantly, his daughter told me that her father had not been taking his BP medication for a long time. No use of anticoagulants or antiplatelets. No recent head trauma or falls.  Upon arrival to ED he had a CT brain that disclosed an acute 2.9 x 1.6 cm hematoma centered along the medial aspect of the right thalamus causing local mass effect upon the right lateral ventricle. No intraventricular extension.  Patient was not a TPA candidate secondary to hemorrhage. He was admitted to the neuro ICU for further evaluation and treatment.  SUBJECTIVE The patient's daughter is present this morning. The patient speaks limited [place]. The family understands that the patient must be compliant with his medications.   OBJECTIVE Most recent Vital Signs: Filed Vitals:   02/26/13 0200 02/26/13 0300 02/26/13 0400 02/26/13 0700  BP: 150/74 140/86 153/74 155/69  Pulse:    69  Temp:   98.5 F (36.9 C)   TempSrc:   Oral   Resp: 16 16 16 16    Height:      Weight:      SpO2: 96%  95% 95%   CBG (last 3)   Recent Labs  02/25/13 1106  GLUCAP 91    IV Fluid Intake:   . sodium chloride 75 mL/hr at 02/26/13 0600    MEDICATIONS  . pantoprazole (PROTONIX) IV  40 mg Intravenous QHS  . senna-docusate  1 tablet Oral BID   PRN:  acetaminophen, acetaminophen, labetalol  Diet:  Heart healthy diet with thin liquids. Activity:  Out of bed DVT Prophylaxis:  SCD  CLINICALLY SIGNIFICANT STUDIES Basic Metabolic Panel:  Recent Labs Lab 02/25/13 1055  NA 136  K 4.0  CL 101  CO2 26  GLUCOSE 96  BUN 16  CREATININE 0.92  CALCIUM 9.0   Liver Function Tests:  Recent Labs Lab 02/25/13 1055  AST 22  ALT 20  ALKPHOS 59  BILITOT 1.1  PROT 7.4  ALBUMIN 3.6   CBC:  Recent Labs Lab 02/25/13 1055  WBC 8.8  NEUTROABS 5.8  HGB 15.5  HCT 45.7  MCV 89.6  PLT 263   Coagulation: No results found for this basename: LABPROT, INR,  in the last 168 hours Cardiac Enzymes: No results found for this basename: CKTOTAL, CKMB, CKMBINDEX, TROPONINI,  in the last 168 hours Urinalysis:  Recent Labs Lab 02/25/13 1123  COLORURINE YELLOW  LABSPEC 1.016  PHURINE 7.0  GLUCOSEU NEGATIVE  HGBUR NEGATIVE  BILIRUBINUR NEGATIVE  KETONESUR NEGATIVE  PROTEINUR NEGATIVE  UROBILINOGEN 1.0  NITRITE NEGATIVE  LEUKOCYTESUR NEGATIVE   Lipid Panel    Component Value Date/Time   CHOL  Value: 178        ATP III CLASSIFICATION:  <200     mg/dL   Desirable  161-096  mg/dL   Borderline High  >=045    mg/dL   High        40/98/1191 0312   TRIG 128 07/04/2010 0312   HDL 30* 07/04/2010 0312   CHOLHDL 5.9 07/04/2010 0312   VLDL 26 07/04/2010 0312   LDLCALC  Value: 122        Total Cholesterol/HDL:CHD Risk Coronary Heart Disease Risk Table                     Men   Women  1/2 Average Risk   3.4   3.3  Average Risk       5.0   4.4  2 X Average Risk   9.6   7.1  3 X Average Risk  23.4   11.0        Use the calculated Patient Ratio above and the CHD  Risk Table to determine the patient's CHD Risk.        ATP III CLASSIFICATION (LDL):  <100     mg/dL   Optimal  478-295  mg/dL   Near or Above                    Optimal  130-159  mg/dL   Borderline  621-308  mg/dL   High  >657     mg/dL   Very High* 84/69/6295 0312   HgbA1C  Lab Results  Component Value Date   HGBA1C  Value: 6.1 (NOTE)                                                                       According to the ADA Clinical Practice Recommendations for 2011, when HbA1c is used as a screening test:   >=6.5%   Diagnostic of Diabetes Mellitus           (if abnormal result  is confirmed)  5.7-6.4%   Increased risk of developing Diabetes Mellitus  References:Diagnosis and Classification of Diabetes Mellitus,Diabetes Care,2011,34(Suppl 1):S62-S69 and Standards of Medical Care in         Diabetes - 2011,Diabetes MWUX,3244,01  (Suppl 1):S11-S61.* 07/03/2010    Urine Drug Screen:   No results found for this basename: labopia, cocainscrnur, labbenz, amphetmu, thcu, labbarb    Alcohol Level: No results found for this basename: ETH,  in the last 168 hours  Ct Head Wo Contrast 02/25/2013  Acute to 2.9 x 1.6 cm hematoma centered along the medial aspect of the right thalamus and is causing local mass effect upon the right lateral ventricle.  02/26/2013  Stable hematoma in the right thalamus since yesterday's examination, measured above.  2. Stable mass effect upon the third ventricle and shift of the midline to the left approximating 8 mm  02/27/2013 Stable appearance of right thalamic hemorrhage with associated 7 mm  of right-to-left midline shift. No hydrocephalous.  2D Echocardiogram  not indicated  Carotid Doppler  not indicated  CXR    EKG  NSR  Therapy Recommendations home health therapies with 24-hour per day supervision.  Physical Exam   Pleasant middle aged [place] male not in distress.Awake alert. Afebrile. Head is nontraumatic. Neck is supple without bruit. Hearing is  normal. Neurological Exam ;  Cognitive evaluation limited due to the language barrier. Patient can follow simple one-step commands.  Mild left lower face asymmetry. Tongue midline. Mild left upper extremity drift otherwise strength is nearly symmetrical. Mild diminished fine finger movements on left. Normal sensation . Normal coordination.    ASSESSMENT Kurt Lewis is a 72 y.o. male presenting with headache and acute delirium. Imaging confirms an acute to 2.9 x 1.6 cm hematoma centered along the medial aspect of the right thalamus and is causing local mass effect upon the right lateral ventricle. Infarct felt to be hypertensive in nature. Patient has been on antihypertensives in past but had stopped them for unknown reasons. He had been on Lisinopril and HCTZ.  On no antithrombotics prior to admission. Now on no antithrombotics for secondary stroke prevention. Patient with resultant transient confusion. Work up completed.   Hypertension  Non compliance  Hyperlipidemia, LDL 122, goal < 100, now on Lipitor 10 mg daily.  Acute delirium, resolved  Right thalamic hemorrhage   Hospital day # 1  TREATMENT/PLAN  Continue no antithrombotics for secondary stroke prevention secondary to bleed.  Lipitor for hyperlipidemia.  Lisinopril added for hypertension.  Albuterol metered-dose inhaler as needed for asthma.  Risk factor modification.  Home health therapies with 24-hour per day supervision recommended.  Plan discharge today to follow up with Dr. Pearlean Brownie in 2 months.  Delton See PA-C [facility] Pager [phone] 02/27/2013, 8:08 AM   I have personally obtained a history, examined the patient, evaluated imaging results, and formulated the assessment and plan of care. I agree with the above.  Pt's family concerned for possibility of falls at home. Will re-eval with PT/OT. Pauletta Browns

## 2013-02-27 NOTE — Progress Notes (Signed)
The plan was to proceed with discharge today and to have home health therapies with 24-hour per day supervision. The patient's nurse contacted me after discussing the situation with the patient's daughter. The family does not feel comfortable taking the patient home at this time. They are concerned that he had had several falls prior to admission and they feel that he might fall and injure himself after discharge. I will cancel the discharge for today and ask the physical and occupational therapists to reevaluate the patient and to work with the family regarding their care issue concerns. Inpatient rehabilitation might also be a consideration if felt to be indicated by the therapists.  Delton See PA-C [facility] Pager [phone] 02/27/2013, 2:09 PM

## 2013-02-27 NOTE — Progress Notes (Signed)
Pt called to use restroom. Spit up small amounts of water. Said yes when asked if he felt nauseous. BP taken. Elevated. PRN medications given x2. Dr. Thad Ranger notified. Stat CT ordered. Will accompany patient to CT. BP now within ordered range.

## 2013-02-27 NOTE — Evaluation (Signed)
Occupational Therapy Evaluation Patient Details Name: Kurt Lewis MRN: [id number] DOB: 1940-09-07 Today's Date: 02/27/2013 Time: 7829-5621 OT Time Calculation (min): 42 min  OT Assessment / Plan / Recommendation History of present illness He was in his usual state of health until 3 days ago when he was called from [place] to inform him that his granddaughter died unexpectedly and ever since he has been acting confused at home and complaining of HA.  CT brain that disclosed an acute 2.9 x 1.6 cm hematoma centered along the medial aspect of the right thalamus causing local mass effect upon the right lateral ventricle.   Clinical Impression   PT admitted with confusion and HA. Pt currently with functional limitiations due to the deficits listed below (see OT problem list).CT (+) 7 mm above right-to-left midline shift of the Right Thalamus Pt will benefit from skilled OT to increase their independence and safety with adls and balance to allow discharge CIR. Pt with balance and cognition deficits. Pt is not at baseline currently and high fall risk.     OT Assessment  Patient needs continued OT Services    Follow Up Recommendations  Supervision/Assistance - 24 hour;CIR (pt's family agreeable to community rehab if required)    Barriers to Discharge      Equipment Recommendations  3 in 1 bedside comode;Other (comment) (RW)    Recommendations for Other Services    Frequency  Min 3X/week    Precautions / Restrictions Precautions Precautions: Fall   Pertinent Vitals/Pain HA Dizziness 5 out 10     ADL  Eating/Feeding: Set up Where Assessed - Eating/Feeding: Chair Grooming: Teeth care;Min guard Where Assessed - Grooming: Supported standing (required tooth paste applied to brush) Toilet Transfer: Minimal assistance Toilet Transfer Method: Sit to Barista: Regular height toilet;Grab bars Toileting - Clothing Manipulation and Hygiene: Maximal assistance Where Assessed  - Engineer, mining and Hygiene: Sit to stand from 3-in-1 or toilet Equipment Used: Gait belt Transfers/Ambulation Related to ADLs: Pt required (A) for bed mobility adn unable to complete supine<> sit eob . pt with LOB at EOB x3. Pt reports dizziness. Pt reports HA in frontal area. Pt with right gaze preference but able to scan to the left with cues. Pt ambulated holding onto bed and environmental surfaces. Pt attempting to push IV pole. Pt unabel to sustain static standing with eyes closed for 10 seconds. pt opening eyes and losing balance posteriorly. pt with decr speed her daughter report. Pt required stopping to communicate. Pt could not walk and talk to daughter ADL Comments: Pt supine on arrival. pt aroused with tactile input. Pt aroused spoke and then falling back to sleep. Pt again aroused and asked to progress to eob. Pt required extended time and (A). Pt ambulated to bathroom and noted wet feel to gown. Pt asked if gown was wet and pt responds yes. Pt unaware prior to gown wetness. Pt completed hand hygiene and needed vc from daughter to locate paper towels on left side. pt turing head to locate. Pt told to walk backwards. pt taking two steps and then terminating task by turning unsteady 180 degrees to walk in the reverse direction. pt given 3 step command "touch your nose  clap your hands and touch your ear" Pt touched toes  and ear. Pt demonstrates some receptive issues ( daughter repeating all commands to translate so patient hearing all sequence commands twice)    OT Diagnosis: Generalized weakness;Cognitive deficits;Disturbance of vision  OT Problem List: Decreased strength;Decreased  activity tolerance;Impaired balance (sitting and/or standing);Impaired vision/perception;Decreased cognition;Decreased knowledge of use of DME or AE;Decreased safety awareness;Decreased knowledge of precautions;Obesity;Pain OT Treatment Interventions: Self-care/ADL training;Neuromuscular education;DME  and/or AE instruction;Therapeutic activities;Cognitive remediation/compensation;Visual/perceptual remediation/compensation;Patient/family education;Balance training   OT Goals(Current goals can be found in the care plan section) Acute Rehab OT Goals Patient Stated Goal: Pt didn't state. OT Goal Formulation: With patient/family Time For Goal Achievement: 03/13/13 Potential to Achieve Goals: Good  Visit Information  Last OT Received On: 02/27/13 Assistance Needed: +1 History of Present Illness: He was in his usual state of health until 3 days ago when he was called from [place] to inform him that his granddaughter died unexpectedly and ever since he has been acting confused at home and complaining of HA.  CT brain that disclosed an acute 2.9 x 1.6 cm hematoma centered along the medial aspect of the right thalamus causing local mass effect upon the right lateral ventricle.       Prior Functioning     Home Living Family/patient expects to be discharged to:: Private residence Living Arrangements: Spouse/significant other Available Help at Discharge: Family Home Equipment: None Prior Function Level of Independence: Independent Communication Communication: Prefers language other than English Dominant Hand: Right         Vision/Perception Vision - History Baseline Vision: Wears glasses all the time Vision - Assessment Vision Assessment: Vision not tested Additional Comments: vision was not formally tested but pt demonstrates some concerns during session and vision needs to be further assessed for left visual fields.    Cognition  Cognition Arousal/Alertness: Awake/alert Behavior During Therapy: Flat affect Overall Cognitive Status: Impaired/Different from baseline Area of Impairment: Orientation;Attention;Memory;Following commands;Safety/judgement Current Attention Level: Sustained Memory: Decreased recall of precautions;Decreased short-term memory Following Commands: Follows  one step commands with increased time;Follows multi-step commands inconsistently Safety/Judgement: Decreased awareness of safety;Decreased awareness of deficits General Comments: Pts daughter present that speaks english and patient understands simple english. pt required incr time for one step commands, pt needed repetition of 2 step commands to complete and unable to recall follow 3 step commands. Pt s daughter repeating 2 step commands.. informed family only to state command once and stop. Pt with delayed response and stopping due to poor recall. Pt reports having trouble understanding daughter and trouble remembering Difficult to assess due to: Non-English speaking    Extremity/Trunk Assessment Upper Extremity Assessment Upper Extremity Assessment: Overall WFL for tasks assessed Lower Extremity Assessment Lower Extremity Assessment: Defer to PT evaluation Cervical / Trunk Assessment Cervical / Trunk Assessment: Kyphotic     Mobility Bed Mobility Bed Mobility: Rolling Right;Right Sidelying to Sit;Sitting - Scoot to [place] of Bed Rolling Right: 4: Min guard (extended time and effort) Right Sidelying to Sit: 4: Min guard;HOB flat (extended time and effort with LOB posteriorly) Sitting - Scoot to Edge of Bed: 3: Mod assist Details for Bed Mobility Assistance: pt supine and pulling on mattress/ sheets to help roll onto right elbow. Pt pushing up onto hands and posterior lean. pt long sitting attempting to progress to EOB sitting. pt became stuck with bil UE elbow extension and unable to sustain. Pt posterior lob x3. therapist educated daughter on progress to EOB using pad and weight shift. Pt followign commands to weight shift. Pt at EOB reports dizziness. Pt demonstrates deficits understanding daughters command to progress to bil LE touching the floor. Transfers Transfers: Sit to Stand;Stand to Sit Sit to Stand: 4: Min assist;With upper extremity assist;From bed (posterior lean x2 attempts to  achieve standing) Stand  to Sit: 4: Min assist;With upper extremity assist;To toilet Details for Transfer Assistance: cues for safety and pt grabbing for environmental help     Exercise     Balance Static Standing Balance Static Standing - Balance Support: Bilateral upper extremity supported;During functional activity Static Standing - Level of Assistance: 4: Min assist Standardized Balance Assessment Standardized Balance Assessment: Berg Balance Test Berg Balance Test Sit to Stand: Able to stand using hands after several tries Standing Unsupported: Needs several tries to stand 30 seconds unsupported Sitting with Back Unsupported but Feet Supported on Floor or Stool: Able to sit 10 seconds Stand to Sit: Sits independently, has uncontrolled descent Transfers: Needs one person to assist Standing Unsupported with Eyes Closed: Unable to keep eyes closed 3 seconds but stays steady Turn 360 Degrees: Needs assistance while turning High Level Balance High Level Balance Activites: Backward walking;Turns High Level Balance Comments: pt demonstrates high fall risk   End of Session OT - End of Session Activity Tolerance: Patient limited by fatigue;Patient limited by pain Patient left: in chair;with call bell/phone within reach;with family/visitor present Nurse Communication: Mobility status;Precautions  GO     Lucile Shutters 02/27/2013, 2:26 PM Pager: [phone]

## 2013-02-28 ENCOUNTER — Inpatient Hospital Stay (HOSPITAL_COMMUNITY): Payer: Medicare Other

## 2013-02-28 LAB — URINALYSIS, ROUTINE W REFLEX MICROSCOPIC
Glucose, UA: 100 mg/dL — AB
Ketones, ur: NEGATIVE mg/dL
Leukocytes, UA: NEGATIVE
Protein, ur: NEGATIVE mg/dL

## 2013-02-28 LAB — CBC WITH DIFFERENTIAL/PLATELET
Basophils Absolute: 0 10*3/uL (ref 0.0–0.1)
Basophils Relative: 0 % (ref 0–1)
Eosinophils Relative: 0 % (ref 0–5)
HCT: 43.7 % (ref 39.0–52.0)
Lymphocytes Relative: 13 % (ref 12–46)
MCH: 31.5 pg (ref 26.0–34.0)
MCHC: 35 g/dL (ref 30.0–36.0)
MCV: 90.1 fL (ref 78.0–100.0)
Monocytes Absolute: 0.9 10*3/uL (ref 0.1–1.0)
RDW: 12.9 % (ref 11.5–15.5)

## 2013-02-28 LAB — BASIC METABOLIC PANEL
BUN: 20 mg/dL (ref 6–23)
CO2: 23 mEq/L (ref 19–32)
CO2: 25 mEq/L (ref 19–32)
Calcium: 8.7 mg/dL (ref 8.4–10.5)
Calcium: 8.6 mg/dL (ref 8.4–10.5)
Creatinine, Ser: 0.93 mg/dL (ref 0.50–1.35)
Creatinine, Ser: 0.99 mg/dL (ref 0.50–1.35)
Glucose, Bld: 121 mg/dL — ABNORMAL HIGH (ref 70–99)

## 2013-02-28 MED ORDER — WHITE PETROLATUM GEL
Status: AC
  Administered 2013-02-28: 21:00:00
  Filled 2013-02-28: qty 5

## 2013-02-28 MED ORDER — SERTRALINE HCL 25 MG PO TABS
25.0000 mg | ORAL_TABLET | Freq: Every day | ORAL | Status: DC
  Administered 2013-02-28 – 2013-03-01 (×2): 25 mg via ORAL
  Filled 2013-02-28 (×3): qty 1

## 2013-02-28 MED ORDER — ENSURE COMPLETE PO LIQD
237.0000 mL | Freq: Three times a day (TID) | ORAL | Status: DC
  Administered 2013-02-28 – 2013-03-01 (×4): 237 mL via ORAL

## 2013-02-28 NOTE — Progress Notes (Signed)
Stroke Team Progress Note  HISTORY Kurt Lewis is a 72 y.o. 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 Patient doesn't speak English and thus his daughter helped to gather all clinical information.  He was in his usual state of health until 3 days prior to admission when he received a call from [place] to inform him that his granddaughter died unexpectedly and ever since he had been acting confused at home and complaining of HA.  For instance, his daughter reported that Kurt Lewis would get up in the middle of the night to cook and when asked what he was doing he was not be able to provide a rationale explanation and instead would continue acting confused. At the same time, he had been bothered by daily HAs that were not associated with nausea, vomiting, vertigo, double vision, difficulty speaking, slurred speech, language or vision impairment. Importantly, his daughter told me that her father had not been taking his BP medication for a long time. No use of anticoagulants or antiplatelets. No recent head trauma or falls.  Upon arrival to ED he had a CT brain that disclosed an acute 2.9 x 1.6 cm hematoma centered along the medial aspect of the right thalamus causing local mass effect upon the right lateral ventricle. No intraventricular extension.  Patient was not a TPA candidate secondary to hemorrhage. He was admitted to the neuro ICU for further evaluation and treatment.  SUBJECTIVE  Nursing reports the patient had nausea during the night and received Zofran. Temperature this morning 100.6. Question etiology.   The patient's daughter is at the bedside this morning. She is a Engineer, civil (consulting) at [employer]. Dr Loretha Brasil discussed with her the family's concerns about taking the patient home. They also discussed the possibility of inpatient rehabilitation. The daughter did  not feel that her father would do well in that setting. She prefers home therapy. She feels that the patient is depressed, in part due to the recent loss of his granddaughter. She requested that the patient be placed on an antidepressant. The plan at this time is to have the therapists work closely with the family to prepare them for home care of the patient.    OBJECTIVE Most recent Vital Signs: Filed Vitals:   02/26/13 0200 02/26/13 0300 02/26/13 0400 02/26/13 0700  BP: 150/74 140/86 153/74 155/69  Pulse:    69  Temp:   98.5 F (36.9 C)   TempSrc:   Oral   Resp: 16 16 16 16   Height:      Weight:      SpO2: 96%  95% 95%   CBG (last 3)   Recent Labs  02/25/13 1106  GLUCAP 91    IV Fluid Intake:   . sodium chloride 75 mL/hr at 02/26/13 0600    MEDICATIONS  . pantoprazole (PROTONIX) IV  40 mg Intravenous QHS  . senna-docusate  1 tablet Oral BID   PRN:  acetaminophen, acetaminophen, labetalol  Diet:  Heart healthy diet with thin liquids. Activity:  Out of bed DVT Prophylaxis:  SCD  CLINICALLY SIGNIFICANT STUDIES Basic Metabolic Panel:  Recent Labs Lab 02/25/13 1055  NA 136  K 4.0  CL 101  CO2 26  GLUCOSE 96  BUN 16  CREATININE 0.92  CALCIUM 9.0   Liver Function Tests:  Recent Labs Lab 02/25/13 1055  AST 22  ALT 20  ALKPHOS 59  BILITOT 1.1  PROT 7.4  ALBUMIN 3.6   CBC:  Recent Labs Lab 02/25/13 1055  WBC 8.8  NEUTROABS 5.8  HGB 15.5  HCT 45.7  MCV 89.6  PLT 263   Coagulation: No results found for this basename: LABPROT, INR,  in the last 168 hours Cardiac Enzymes: No results found for this basename: CKTOTAL, CKMB, CKMBINDEX, TROPONINI,  in the last 168 hours Urinalysis:  Recent Labs Lab 02/25/13 1123  COLORURINE YELLOW  LABSPEC 1.016  PHURINE 7.0  GLUCOSEU NEGATIVE  HGBUR NEGATIVE  BILIRUBINUR NEGATIVE  KETONESUR NEGATIVE  PROTEINUR NEGATIVE  UROBILINOGEN 1.0  NITRITE NEGATIVE  LEUKOCYTESUR NEGATIVE   Lipid Panel    Component  Value Date/Time   CHOL  Value: 178        ATP III CLASSIFICATION:  <200     mg/dL   Desirable  161-096  mg/dL   Borderline High  >=045    mg/dL   High        40/98/1191 0312   TRIG 128 07/04/2010 0312   HDL 30* 07/04/2010 0312   CHOLHDL 5.9 07/04/2010 0312   VLDL 26 07/04/2010 0312   LDLCALC  Value: 122        Total Cholesterol/HDL:CHD Risk Coronary Heart Disease Risk Table                     Men   Women  1/2 Average Risk   3.4   3.3  Average Risk       5.0   4.4  2 X Average Risk   9.6   7.1  3 X Average Risk  23.4   11.0        Use the calculated Patient Ratio above and the CHD Risk Table to determine the patient's CHD Risk.        ATP III CLASSIFICATION (LDL):  <100     mg/dL   Optimal  478-295  mg/dL   Near or Above                    Optimal  130-159  mg/dL   Borderline  621-308  mg/dL   High  >657     mg/dL   Very High* 84/69/6295 0312   HgbA1C  Lab Results  Component Value Date   HGBA1C  Value: 6.1 (NOTE)                                                                       According to the ADA Clinical Practice Recommendations for 2011, when HbA1c is used as a screening test:   >=6.5%   Diagnostic of Diabetes Mellitus           (if abnormal result  is confirmed)  5.7-6.4%   Increased risk of developing Diabetes Mellitus  References:Diagnosis and Classification of Diabetes Mellitus,Diabetes Care,2011,34(Suppl 1):S62-S69 and Standards of Medical Care in         Diabetes - 2011,Diabetes MWUX,3244,01  (Suppl 1):S11-S61.* 07/03/2010    Urine Drug Screen:   No results found for this basename: labopia, cocainscrnur, labbenz, amphetmu, thcu, labbarb    Alcohol Level: No results found for this basename: ETH,  in the last 168 hours  Ct  Head Wo Contrast 02/25/2013  Acute to 2.9 x 1.6 cm hematoma centered along the medial aspect of the right thalamus and is causing local mass effect upon the right lateral ventricle.  02/26/2013  Stable hematoma in the right thalamus since yesterday's examination,  measured above.  2. Stable mass effect upon the third ventricle and shift of the midline to the left approximating 8 mm  02/27/2013 Stable appearance of right thalamic hemorrhage with associated 7 mm  of right-to-left midline shift. No hydrocephalous.  2D Echocardiogram  not indicated  Carotid Doppler  not indicated  CXR    EKG  NSR  Therapy Recommendations home health therapies with 24-hour per day supervision.  Physical Exam   Pleasant middle aged [place] male not in distress.Awake alert. Afebrile. Head is nontraumatic. Neck is supple without bruit. Hearing is normal. Neurological Exam ;  Cognitive evaluation limited due to the language barrier. Patient can follow simple one-step commands.  Mild left lower face asymmetry. Tongue midline. Mild left upper extremity drift otherwise strength is nearly symmetrical. Mild diminished fine finger movements on left. Normal sensation . Normal coordination.    ASSESSMENT Kurt Lewis is a 72 y.o. male presenting with headache and acute delirium. Imaging confirms an acute to 2.9 x 1.6 cm hematoma centered along the medial aspect of the right thalamus and is causing local mass effect upon the right lateral ventricle. Infarct felt to be hypertensive in nature. Patient has been on antihypertensives in past but had stopped them for unknown reasons. He had been on Lisinopril and HCTZ.  On no antithrombotics prior to admission. Now on no antithrombotics for secondary stroke prevention. Patient with resultant transient confusion. Work up completed.   Hypertension  Non compliance  Hyperlipidemia, LDL 122, goal < 100, now on Lipitor 10 mg daily.  Acute delirium, resolved  Right thalamic hemorrhage   Hospital day # 1  TREATMENT/PLAN  Continue no antithrombotics for secondary stroke prevention secondary to bleed.  Lipitor for hyperlipidemia.  Lisinopril added for hypertension.  Albuterol metered-dose inhaler as needed for asthma.  Risk  factor modification.  Home health therapies with 24-hour per day supervision recommended. Therapist's to instruct family in patient care.  Zoloft 25 mg daily started by Dr. Loretha Brasil per daughter's request.  Fever 100.6 this a.m. - CBC with differential today reveals WBCs 12.3 with a left shift. Bmet, urinalysis, and chest x-ray pending.  Delton See PA-C [facility] Pager [phone] 02/28/2013, 11:34 AM   I have personally obtained a history, examined the patient, evaluated imaging results, and formulated the assessment and plan of care. I agree with the above.  Elevated WBC with shift. Pt was febrile this AM. Work up underway.  Started on SSRI As per family feels save to take home with 24hr supervision.  D/C in AM when above work up complete Pauletta Browns

## 2013-02-28 NOTE — Progress Notes (Signed)
Patient's family refuses to abide by our bed alarm policy and constantly are getting the patient up to the bathroom and to walk around the room without calling for assistance.  The patient currently has 10 visitors in his room and is lying on the couch.  Lance Bosch, RN

## 2013-03-01 DIAGNOSIS — R51 Headache: Secondary | ICD-10-CM | POA: Diagnosis present

## 2013-03-01 DIAGNOSIS — R410 Disorientation, unspecified: Secondary | ICD-10-CM | POA: Diagnosis present

## 2013-03-01 DIAGNOSIS — I610 Nontraumatic intracerebral hemorrhage in hemisphere, subcortical: Secondary | ICD-10-CM | POA: Diagnosis present

## 2013-03-01 DIAGNOSIS — Z79899 Other long term (current) drug therapy: Secondary | ICD-10-CM

## 2013-03-01 DIAGNOSIS — R519 Headache, unspecified: Secondary | ICD-10-CM | POA: Diagnosis present

## 2013-03-01 LAB — CBC
MCH: 31.3 pg (ref 26.0–34.0)
MCHC: 35.3 g/dL (ref 30.0–36.0)
MCV: 88.9 fL (ref 78.0–100.0)
Platelets: 277 10*3/uL (ref 150–400)
RDW: 12.6 % (ref 11.5–15.5)
WBC: 9.7 10*3/uL (ref 4.0–10.5)

## 2013-03-01 MED ORDER — SERTRALINE HCL 25 MG PO TABS: 25.0000 mg | ORAL_TABLET | Freq: Every day | ORAL | Status: DC

## 2013-03-01 NOTE — Progress Notes (Addendum)
Physical Therapy Treatment Patient Details Name: Kurt Lewis MRN: [id number] DOB: [birth year] Today's Date: 03/01/2013 Time: 4098-1191 PT Time Calculation (min): 25 min  PT Assessment / Plan / Recommendation  History of Present Illness He was in his usual state of health until 3 days ago when he was called from [place] to inform him that his granddaughter died unexpectedly and ever since he has been acting confused at home and complaining of HA.  CT brain that disclosed an acute 2.9 x 1.6 cm hematoma centered along the medial aspect of the right thalamus causing local mass effect upon the right lateral ventricle.   PT Comments   Patient continues to have decreased balance and safety with mobility and requires A with ambulation. Spoke with wife and daughter who both believe that they would like patient to return home with [facility] services vs. Rehab. They believe patient would do better in home environment and they have arranged A throughout the day.   Follow Up Recommendations  Home health PT;Supervision/Assistance - 24 hour     Does the patient have the potential to tolerate intense rehabilitation     Barriers to Discharge        Equipment Recommendations  None recommended by PT    Recommendations for Other Services    Frequency Min 4X/week   Progress towards PT Goals Progress towards PT goals: Progressing toward goals  Plan Current plan remains appropriate    Precautions / Restrictions Precautions Precautions: Fall   Pertinent Vitals/Pain Complains of headache but unable to rate    Mobility  Bed Mobility Supine to Sit: 5: Supervision;With rails;HOB elevated Details for Bed Mobility Assistance: No assistance required today with bed mobility. Supervision for safety Transfers Sit to Stand: With upper extremity assist;From bed;4: Min guard Stand to Sit: 4: Min guard;With upper extremity assist;With armrests;To chair/3-in-1 Details for Transfer Assistance: Cues for  safety Ambulation/Gait Ambulation/Gait Assistance: 4: Min assist Ambulation Distance (Feet): 200 Feet Assistive device: 1 person hand held assist Ambulation/Gait Assistance Details: HHA on R side occassionally. Patient with some swayying and scissoring of gait but no LOB noted this session Gait Pattern: Decreased step length - right;Decreased step length - left;Shuffle Gait velocity: decr Stairs: Yes Stairs Assistance: 4: Min assist Stairs Assistance Details (indicate cue type and reason): HHA on R. Cues to family for technique Stair Management Technique: Step to pattern;Forwards;No rails Number of Stairs: 3    Exercises     PT Diagnosis:    PT Problem List:   PT Treatment Interventions:     PT Goals (current goals can now be found in the care plan section)    Visit Information  Last PT Received On: 03/01/13 Assistance Needed: +1 History of Present Illness: He was in his usual state of health until 3 days ago when he was called from [place] to inform him that his granddaughter died unexpectedly and ever since he has been acting confused at home and complaining of HA.  CT brain that disclosed an acute 2.9 x 1.6 cm hematoma centered along the medial aspect of the right thalamus causing local mass effect upon the right lateral ventricle.    Subjective Data      Cognition  Cognition Arousal/Alertness: Awake/alert Behavior During Therapy: Flat affect Overall Cognitive Status: Within Functional Limits for tasks assessed    Balance  High Level Balance High Level Balance Activites: Side stepping;Turns;Sudden stops;Head turns High Level Balance Comments: Min A for balance.   End of Session PT - End of Session Activity  Tolerance: Patient tolerated treatment well Patient left: in chair;with call bell/phone within reach;with family/visitor present Nurse Communication: Mobility status   GP     Fredrich Birks 03/01/2013, 11:05 AM 03/01/2013 Fredrich Birks  PTA [phone] pager [phone] office    Added stairs this session as patient has several to get into his house

## 2013-03-01 NOTE — Discharge Summary (Signed)
Stroke Discharge Summary  Patient ID: Kurt Lewis   MRN: [id number]      DOB: Jul 26, 1940  Date of Admission: 02/25/2013 Date of Discharge: 03/01/2013  Attending Physician:  Darcella Cheshire, MD, Stroke MD  Consulting Physician(s):     rehabilitation medicine  Patient's PCP:  Priscille Heidelberg  Discharge Diagnoses:  Principal Problem:   Thalamic hemorrhage Active Problems:   Hemorrhage, intracranial   Malignant hypertension   Other and unspecified hyperlipidemia   Noncompliance with medication regimen   Unspecified asthma(493.90)   Essential hypertension, benign   Headache(784.0)   Encounter for long-term (current) use of medications   Acute delirium  BMI: Body mass index is 29.84 kg/(m^2).  Past Medical History  Diagnosis Date  . Hypertension   . Stroke    History reviewed. No pertinent past surgical history.    Medication List         acetaminophen 500 MG tablet  Commonly known as:  TYLENOL  Take 1,000 mg by mouth daily as needed (for headache).     albuterol 108 (90 BASE) MCG/ACT inhaler  Commonly known as:  PROVENTIL HFA;VENTOLIN HFA  Inhale 2 puffs into the lungs every 6 (six) hours as needed for wheezing or shortness of breath.     atorvastatin 10 MG tablet  Commonly known as:  LIPITOR  Take 1 tablet (10 mg total) by mouth daily at 6 PM.     benzonatate 100 MG capsule  Commonly known as:  TESSALON  Take 100 mg by mouth 3 (three) times daily as needed for cough.     lisinopril 5 MG tablet  Commonly known as:  PRINIVIL,ZESTRIL  Take 1 tablet (5 mg total) by mouth daily.     sertraline 25 MG tablet  Commonly known as:  ZOLOFT  Take 1 tablet (25 mg total) by mouth daily.        LABORATORY STUDIES CBC    Component Value Date/Time   WBC 9.7 03/01/2013 0605   RBC 4.69 03/01/2013 0605   HGB 14.7 03/01/2013 0605   HCT 41.7 03/01/2013 0605   PLT 277 03/01/2013 0605   MCV 88.9 03/01/2013 0605   MCH 31.3 03/01/2013 0605   MCHC 35.3 03/01/2013 0605   RDW 12.6  03/01/2013 0605   LYMPHSABS 1.5 02/28/2013 1040   MONOABS 0.9 02/28/2013 1040   EOSABS 0.0 02/28/2013 1040   BASOSABS 0.0 02/28/2013 1040   CMP    Component Value Date/Time   NA 133* 02/28/2013 1602   K 4.2 02/28/2013 1602   CL 101 02/28/2013 1602   CO2 25 02/28/2013 1602   GLUCOSE 121* 02/28/2013 1602   BUN 20 02/28/2013 1602   CREATININE 0.99 02/28/2013 1602   CALCIUM 8.6 02/28/2013 1602   PROT 7.4 02/25/2013 1055   ALBUMIN 3.6 02/25/2013 1055   AST 22 02/25/2013 1055   ALT 20 02/25/2013 1055   ALKPHOS 59 02/25/2013 1055   BILITOT 1.1 02/25/2013 1055   GFRNONAA 81* 02/28/2013 1602   GFRAA >90 02/28/2013 1602   COAGS Lab Results  Component Value Date   INR 0.86 07/02/2010   Lipid Panel    Component Value Date/Time   CHOL  Value: 178        ATP III CLASSIFICATION:  <200     mg/dL   Desirable  409-811  mg/dL   Borderline High  >=914    mg/dL   High        78/29/5621 0312   TRIG 128 07/04/2010  8119   HDL 30* 07/04/2010 0312   CHOLHDL 5.9 07/04/2010 0312   VLDL 26 07/04/2010 0312   LDLCALC  Value: 122        Total Cholesterol/HDL:CHD Risk Coronary Heart Disease Risk Table                     Men   Women  1/2 Average Risk   3.4   3.3  Average Risk       5.0   4.4  2 X Average Risk   9.6   7.1  3 X Average Risk  23.4   11.0        Use the calculated Patient Ratio above and the CHD Risk Table to determine the patient's CHD Risk.        ATP III CLASSIFICATION (LDL):  <100     mg/dL   Optimal  147-829  mg/dL   Near or Above                    Optimal  130-159  mg/dL   Borderline  562-130  mg/dL   High  >865     mg/dL   Very High* 78/46/9629 0312   HgbA1C  Lab Results  Component Value Date   HGBA1C  Value: 6.1 (NOTE)                                                                       According to the ADA Clinical Practice Recommendations for 2011, when HbA1c is used as a screening test:   >=6.5%   Diagnostic of Diabetes Mellitus           (if abnormal result  is confirmed)  5.7-6.4%   Increased risk  of developing Diabetes Mellitus  References:Diagnosis and Classification of Diabetes Mellitus,Diabetes Care,2011,34(Suppl 1):S62-S69 and Standards of Medical Care in         Diabetes - 2011,Diabetes Care,2011,34  (Suppl 1):S11-S61.* 07/03/2010   Cardiac Panel (last 3 results) No results found for this basename: CKTOTAL, CKMB, TROPONINI, RELINDX,  in the last 72 hours Urinalysis    Component Value Date/Time   COLORURINE YELLOW 02/28/2013 1830   APPEARANCEUR CLEAR 02/28/2013 1830   LABSPEC 1.016 02/28/2013 1830   PHURINE 6.5 02/28/2013 1830   GLUCOSEU 100* 02/28/2013 1830   HGBUR NEGATIVE 02/28/2013 1830   BILIRUBINUR NEGATIVE 02/28/2013 1830   KETONESUR NEGATIVE 02/28/2013 1830   PROTEINUR NEGATIVE 02/28/2013 1830   UROBILINOGEN 1.0 02/28/2013 1830   NITRITE NEGATIVE 02/28/2013 1830   LEUKOCYTESUR NEGATIVE 02/28/2013 1830   Urine Drug Screen  No results found for this basename: labopia, cocainscrnur, labbenz, amphetmu, thcu, labbarb    Alcohol Level    Component Value Date/Time   ETH  Value: <5        LOWEST DETECTABLE LIMIT FOR SERUM ALCOHOL IS 5 mg/dL FOR MEDICAL PURPOSES ONLY 07/02/2010 1627     SIGNIFICANT DIAGNOSTIC STUDIES  Ct Head Wo Contrast  02/25/2013 Acute to 2.9 x 1.6 cm hematoma centered along the medial aspect of the right thalamus and is causing local mass effect upon the right lateral ventricle.  02/26/2013 Stable hematoma in the right thalamus since yesterday's examination, measured above.  2. Stable mass effect upon the  third ventricle and shift of the midline to the left approximating 8 mm  02/27/2013  Stable appearance of right thalamic hemorrhage with associated 7 mm  of right-to-left midline shift. No hydrocephalous.    History of Present Illness    Kurt Lewis is a 72 y.o. 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  Patient  doesn't speak English and thus his daughter helped to gather all clinical information.  He was in his usual state of health until 3 days prior to admission when he received a call from [place] to inform him that his granddaughter died unexpectedly and ever since he had been acting confused at home and complaining of HA.  For instance, his daughter reported that Mr. [place] would get up in the middle of the night to cook and when asked what he was doing he was not be able to provide a rationale explanation and instead would continue acting confused. At the same time, he had been bothered by daily HAs that were not associated with nausea, vomiting, vertigo, double vision, difficulty speaking, slurred speech, language or vision impairment. Importantly, his daughter told me that her father had not been taking his BP medication for a long time. No use of anticoagulants or antiplatelets. No recent head trauma or falls.  Upon arrival to ED he had a CT brain that disclosed an acute 2.9 x 1.6 cm hematoma centered along the medial aspect of the right thalamus causing local mass effect upon the right lateral ventricle. No intraventricular extension.  Patient was not a TPA candidate secondary to hemorrhage. He was admitted to the neuro ICU for further evaluation and treatment   Hospital Course   Mr. Donaldson Richter is a 73 y.o. male presenting with headache and acute delirium. Imaging confirms an acute to 2.9 x 1.6 cm hematoma centered along the medial aspect of the right thalamus and is causing local mass effect upon the right lateral ventricle. Infarct felt to be hypertensive in nature. Patient has been on antihypertensives in past but had stopped them for unknown reasons. He had been on Lisinopril and HCTZ. On no antithrombotics prior to admission. Now on no antithrombotics for secondary stroke prevention. Patient with resultant transient confusion. Work up completed. Treatment of his acute problems include: Hypertension on  ACE  Right thalamic hemorrhage  Non compliance  Hyperlipidemia, LDL 122, goal < 100, now on Lipitor 10 mg daily.  Acute delirium, resolved  Right thalamic hemorrhage  Asthma, on albuterol  Depression, the daughter felt that the patient may feel depressed, but not suicidal, patient was started on zoloft at the daughters request. [phone] her phone number, name Hyiu Hsor) Fever 100.6 yesterday, wbc normal as well as urine and chest xray, normal temperatures today  TREATMENT/PLAN  Risk factor modification.  Encourage compliance  Home health therapies PO/OT  Patient to follow up with Dr Pearlean Brownie in 2 months.   Patient with continued stroke symptoms of balance and gait disorder. Physical therapy, occupational therapy and speech therapy evaluated patient. They recommend home health.  Discharge Exam  Blood pressure 165/72, pulse 66, temperature 99.8 F (37.7 C), temperature source Oral, resp. rate 20, height 5\' 4"  (1.626 m), weight 78.9 kg (173 lb 15.1 oz), SpO2 94.00%.   Physical Exam  Pleasant middle aged [place] male not in distress.Awake alert. Afebrile. Head is nontraumatic. Neck is supple without bruit. Hearing  is normal.  Neurological Exam ;  Cognitive evaluation limited due to the language barrier. Patient can follow simple one-step commands. Mild left lower face asymmetry. Tongue midline. Mild left upper extremity drift otherwise strength is nearly symmetrical. Mild diminished fine finger movements on left. Normal sensation . Normal coordination  Discharge Diet   Cardiac thin liquids  Discharge Plan    Disposition: home  No antithrombotic for secondary stroke prevention.  Ongoing risk factor control by Primary Care Physician. Risk factor recommendations:  Hypertension target range 130-140/70-80 Lipid range - LDL < 100 and checked every 6 months, fasting Diabetes - HgB A1C <7   Follow-up your primary MD in 1 month.  Follow-up with Dr. Delia Heady, Stroke Clinic in 2  months.  35 minutes were spent preparing discharge.  Signed  Gwendolyn Lima. Manson Passey, [facility], MBA, MHA [facility] Pager: [phone] 03/01/2013 1:28 PM  I have personally examined this patient, reviewed pertinent data and developed the plan of care. I agree with above. Delia Heady, MD

## 2013-03-01 NOTE — Progress Notes (Signed)
Occupational Therapy Treatment Patient Details Name: Kurt Lewis MRN: [id number] DOB: Oct 01, 1940 Today's Date: 03/01/2013 Time: 1191-4782 OT Time Calculation (min): 23 min  OT Assessment / Plan / Recommendation  History of present illness He was in his usual state of health until 3 days ago when he was called from [place] to inform him that his granddaughter died unexpectedly and ever since he has been acting confused at home and complaining of HA.  CT brain that disclosed an acute 2.9 x 1.6 cm hematoma centered along the medial aspect of the right thalamus causing local mass effect upon the right lateral ventricle.   OT comments  Pt. C/o severe HA with some dizziness but agreeable to participation in skilled o.t., able to amb. To/from b.room with hha with min cues for visual scanning to locate b.room door to the left  Follow Up Recommendations  Supervision/Assistance - 24 hour;Home health OT           Equipment Recommendations  3 in 1 bedside comode        Frequency Min 3X/week   Progress towards OT Goals Progress towards OT goals: Progressing toward goals  Plan Discharge plan remains appropriate    Precautions / Restrictions Precautions Precautions: Fall   Pertinent Vitals/Pain 10/10 for HA, r.n. Notified to see if pt. Due for pain meds    ADL  Grooming: Performed;Teeth care;Min guard Where Assessed - Grooming: Unsupported standing Toilet Transfer: Buyer, retail Method: Sit to stand Transfers/Ambulation Related to ADLs: sit/stand cga, amb.to/from b.room hha secondary to reported severe HA and dizziness, min cues for visual scanning to look left and locate b.room door ADL Comments: able to peform grooming in standing with cga         OT Goals(current goals can now be found in the care plan section)    Visit Information  Last OT Received On: 03/01/13 Assistance Needed: +1 History of Present Illness: He was in his usual state of health until 3  days ago when he was called from [place] to inform him that his granddaughter died unexpectedly and ever since he has been acting confused at home and complaining of HA.  CT brain that disclosed an acute 2.9 x 1.6 cm hematoma centered along the medial aspect of the right thalamus causing local mass effect upon the right lateral ventricle.                 Cognition  Cognition Arousal/Alertness: Awake/alert Behavior During Therapy: Flat affect Overall Cognitive Status: Within Functional Limits for tasks assessed Following Commands: Follows one step commands consistently    Mobility  Bed Mobility Bed Mobility: Rolling Left;Supine to Sit;Sitting - Scoot to Edge of Bed;Left Sidelying to Sit;Sit to Sidelying Left Rolling Left: 5: Supervision Left Sidelying to Sit: 5: Supervision;HOB flat Supine to Sit: 5: Supervision;HOB flat Sitting - Scoot to Edge of Bed: 5: Supervision Sit to Sidelying Left: 5: Supervision Details for Bed Mobility Assistance: hob flat with no rails to simulate home environment, s for in/out Transfers Transfers: Sit to Stand;Stand to Sit Sit to Stand: 4: Min guard;With upper extremity assist;From bed Stand to Sit: 5: Supervision;With upper extremity assist;To bed Details for Transfer Assistance: Cues for safety          Balance High Level Balance High Level Balance Activites: Side stepping;Turns;Sudden stops;Head turns High Level Balance Comments: Min A for balance.    End of Session OT - End of Session Activity Tolerance: Patient tolerated treatment well Patient left: in bed;with call bell/phone  within reach;with family/visitor present       Robet Leu, COTA/L 03/01/2013, 12:56 PM

## 2013-03-01 NOTE — Care Management Note (Signed)
    Page 1 of 1   03/01/2013     10:41:07 AM   CARE MANAGEMENT NOTE 03/01/2013  Patient:  Kurt Lewis, Kurt Lewis   Account Number:  [id number]  Date Initiated:  03/01/2013  Documentation initiated by:  Elmer Bales  Subjective/Objective Assessment:   Patient was admitted for intracerebral hemorrhage.  Lives at home with family.     Action/Plan:   Will follow for discharge needs.   Anticipated DC Date:  03/01/2013   Anticipated DC Plan:  HOME W HOME HEALTH SERVICES      DC Planning Services  CM consult      Choice offered to / List presented to:  C-4 Adult Children        HH arranged  HH-2 PT  HH-3 OT  HH-4 NURSE'S AIDE  HH-6 SOCIAL WORKER      HH agency  [facility]   Status of service:  Completed, signed off Medicare Important Message given?   (If response is "NO", the following Medicare IM given date fields will be blank) Date Medicare IM given:   Date Additional Medicare IM given:    Discharge Disposition:    Per UR Regulation:  Reviewed for med. necessity/level of care/duration of stay  If discussed at Long Length of Stay Meetings, dates discussed:    Comments:  03/01/13 1030 Elmer Bales RN, MSN, CM- Met with patient and family to discuss home health needs. Patient and wife are non-English speaking.  Daughter at bedside is fluent in [place] and chose [facility] for PT/OT/NA/SW.  Per patient's daughter, patient may not be comfortable with a nurse aid. This information was shared with Hilda Lias at [facility], who has accepted the referral.

## 2013-03-01 NOTE — Progress Notes (Signed)
Rehab Admissions Coordinator Note:  Patient was screened by Clois Dupes for appropriateness for an Inpatient Acute Rehab Consult. Noted family ambulating pt to bathroom and prefer d/c home with home health. At this time, we are recommending HH if pt progresses today with therapy to be realistic plan today.   Clois Dupes 03/01/2013, 8:47 AM  I can be reached at [phone].

## 2013-03-01 NOTE — Progress Notes (Signed)
Pt and daughter given D/C instructions with Rx's. Pt's daughter verbalized understanding of teaching, Pt has some mild confusion. Pt's HH [facility] setup with [facility] for PT/OT by Toni Amend, CM prior to D/C. Pt D/C'd home via wheelchair with daughter and wife per MD order. Rema Fendt, RN

## 2013-03-01 NOTE — Progress Notes (Signed)
Pt's PCP is Fleet Contras.

## 2013-03-01 NOTE — Progress Notes (Addendum)
Stroke Team Progress Note  HISTORY Kurt Lewis is a 72 y.o. male with a past medical history significant for HTN, stroke few years ago with residual right face weakness, brought to [facility] ED for further evaluation of HA and confusion on 02/25/2013. Patient doesn't speak English and thus his daughter helped to gather all clinical information.  He was in his usual state of health until 3 days prior to admission when he received a call from [place] to inform him that his granddaughter died unexpectedly and ever since he had been acting confused at home and complaining of HA.  For instance, his daughter reported that Kurt Lewis would get up in the middle of the night to cook and when asked what he was doing he was not be able to provide a rationale explanation and instead would continue acting confused. At the same time, he had been bothered by daily HAs that were not associated with nausea, vomiting, vertigo, double vision, difficulty speaking, slurred speech, language or vision impairment. Importantly, his daughter told me that her father had not been taking his BP medication for a long time. No use of anticoagulants or antiplatelets. No recent head trauma or falls.  Upon arrival to ED he had a CT brain that disclosed an acute 2.9 x 1.6 cm hematoma centered along the medial aspect of the right thalamus causing local mass effect upon the right lateral ventricle. No intraventricular extension.  Patient was not a TPA candidate secondary to hemorrhage. He was admitted to the neuro ICU for further evaluation and treatment.  SUBJECTIVE Patient doing well. Wants to go home. No new events.    OBJECTIVE Most recent Vital Signs: Filed Vitals:   02/26/13 0200 02/26/13 0300 02/26/13 0400 02/26/13 0700  BP: 150/74 140/86 153/74 155/69  Pulse:    69  Temp:   98.5 F (36.9 C)   TempSrc:   Oral   Resp: 16 16 16 16   Height:      Weight:      SpO2: 96%  95% 95%   CBG (last 3)   Recent Labs  02/25/13 1106  GLUCAP  91    IV Fluid Intake:   . sodium chloride 75 mL/hr at 02/26/13 0600    MEDICATIONS  . pantoprazole (PROTONIX) IV  40 mg Intravenous QHS  . senna-docusate  1 tablet Oral BID   PRN:  acetaminophen, acetaminophen, labetalol  Diet:  Heart healthy diet with thin liquids. Activity:  Out of bed DVT Prophylaxis:  SCD  CLINICALLY SIGNIFICANT STUDIES Basic Metabolic Panel:  Recent Labs Lab 02/25/13 1055  NA 136  K 4.0  CL 101  CO2 26  GLUCOSE 96  BUN 16  CREATININE 0.92  CALCIUM 9.0   Liver Function Tests:  Recent Labs Lab 02/25/13 1055  AST 22  ALT 20  ALKPHOS 59  BILITOT 1.1  PROT 7.4  ALBUMIN 3.6   CBC:  Recent Labs Lab 02/25/13 1055  WBC 8.8  NEUTROABS 5.8  HGB 15.5  HCT 45.7  MCV 89.6  PLT 263   Coagulation: No results found for this basename: LABPROT, INR,  in the last 168 hours Cardiac Enzymes: No results found for this basename: CKTOTAL, CKMB, CKMBINDEX, TROPONINI,  in the last 168 hours Urinalysis:  Recent Labs Lab 02/25/13 1123  COLORURINE YELLOW  LABSPEC 1.016  PHURINE 7.0  GLUCOSEU NEGATIVE  HGBUR NEGATIVE  BILIRUBINUR NEGATIVE  KETONESUR NEGATIVE  PROTEINUR NEGATIVE  UROBILINOGEN 1.0  NITRITE NEGATIVE  LEUKOCYTESUR NEGATIVE   Lipid Panel  Component Value Date/Time   CHOL  Value: 178        ATP III CLASSIFICATION:  <200     mg/dL   Desirable  161-096  mg/dL   Borderline High  >=045    mg/dL   High        40/98/1191 0312   TRIG 128 07/04/2010 0312   HDL 30* 07/04/2010 0312   CHOLHDL 5.9 07/04/2010 0312   VLDL 26 07/04/2010 0312   LDLCALC  Value: 122        Total Cholesterol/HDL:CHD Risk Coronary Heart Disease Risk Table                     Men   Women  1/2 Average Risk   3.4   3.3  Average Risk       5.0   4.4  2 X Average Risk   9.6   7.1  3 X Average Risk  23.4   11.0        Use the calculated Patient Ratio above and the CHD Risk Table to determine the patient's CHD Risk.        ATP III CLASSIFICATION (LDL):  <100     mg/dL   Optimal   478-295  mg/dL   Near or Above                    Optimal  130-159  mg/dL   Borderline  621-308  mg/dL   High  >657     mg/dL   Very High* 84/69/6295 0312   HgbA1C  Lab Results  Component Value Date   HGBA1C  Value: 6.1 (NOTE)                                                                       According to the ADA Clinical Practice Recommendations for 2011, when HbA1c is used as a screening test:   >=6.5%   Diagnostic of Diabetes Mellitus           (if abnormal result  is confirmed)  5.7-6.4%   Increased risk of developing Diabetes Mellitus  References:Diagnosis and Classification of Diabetes Mellitus,Diabetes Care,2011,34(Suppl 1):S62-S69 and Standards of Medical Care in         Diabetes - 2011,Diabetes MWUX,3244,01  (Suppl 1):S11-S61.* 07/03/2010    Urine Drug Screen:   No results found for this basename: labopia, cocainscrnur, labbenz, amphetmu, thcu, labbarb    Alcohol Level: No results found for this basename: ETH,  in the last 168 hours  Ct Head Wo Contrast 02/25/2013  Acute to 2.9 x 1.6 cm hematoma centered along the medial aspect of the right thalamus and is causing local mass effect upon the right lateral ventricle.  02/26/2013  Stable hematoma in the right thalamus since yesterday's examination, measured above.  2. Stable mass effect upon the third ventricle and shift of the midline to the left approximating 8 mm  02/27/2013 Stable appearance of right thalamic hemorrhage with associated 7 mm  of right-to-left midline shift. No hydrocephalous.  2D Echocardiogram  not indicated  Carotid Doppler  not indicated  CXR    EKG  NSR  Therapy Recommendations home health therapies   Physical Exam   Pleasant middle aged  [place] male not in distress.Awake alert. Afebrile. Head is nontraumatic. Neck is supple without bruit. Hearing is normal. Neurological Exam ;  Cognitive evaluation limited due to the language barrier. Patient can follow simple one-step commands.  Mild left lower  face asymmetry. Tongue midline. Mild left upper extremity drift otherwise strength is nearly symmetrical. Mild diminished fine finger movements on left. Normal sensation . Normal coordination.    ASSESSMENT Kurt Lewis is a 73 y.o. male presenting with headache and acute delirium. Imaging confirms an acute to 2.9 x 1.6 cm hematoma centered along the medial aspect of the right thalamus and is causing local mass effect upon the right lateral ventricle. Infarct felt to be hypertensive in nature. Patient has been on antihypertensives in past but had stopped them for unknown reasons. He had been on Lisinopril and HCTZ.  On no antithrombotics prior to admission. Now on no antithrombotics for secondary stroke prevention. Patient with resultant transient confusion. Work up completed.   Hypertension on ACE  Right thalamic hemorrhage  Non compliance  Hyperlipidemia, LDL 122, goal < 100, now on Lipitor 10 mg daily.  Acute delirium, resolved  Right thalamic hemorrhage  Asthma, on albuterol  Hyponatremia, improved.  Depression, started on zoloft  Fever 100.6 yesterday, wbc normal as well as urine and chest xray, normal temperatures today   Hospital day # 1  TREATMENT/PLAN  Risk factor modification.  Encourage compliance  Home health therapies PT/OT  Zoloft 25 mg daily started by Dr. Loretha Brasil per daughter's request.  Plan for discharge later today  Patient to follow up with Dr Pearlean Brownie in 2 months.  Dr. Pearlean Brownie has spoken to the Daughter Hyiu Hsor [phone]) regarding discharge planning.  Gwendolyn Lima. Manson Passey, [facility], MBA, MHA [facility] Pager: [phone] 03/01/2013 11:22 AM  I have personally obtained a history, examined the patient, evaluated imaging results, and formulated the assessment and plan of care. I agree with the above.  Delia Heady, MD

## 2013-03-01 NOTE — Progress Notes (Signed)
On call neurologist made aware of pt's blood pressure. No new orders at this time. Will continue to monitor.

## 2013-03-01 NOTE — Clinical Documentation Improvement (Signed)
THIS DOCUMENT IS NOT A PERMANENT PART OF THE MEDICAL RECORD  Please update your documentation within the medical record to reflect your response to this query. If you need help knowing how to do this please call [phone].  03/01/13  Dear Larita Fife,  In a better effort to capture your patient's severity of illness, reflect appropriate length of stay and utilization of resources, a review of the medical record has revealed the following indicators.   Based on your clinical judgment, please clarify and document in a progress note and/or discharge summary the clinical condition associated with the following supporting information: In responding to this query please exercise your independent judgment.  The fact that a query is asked, does not imply that any particular answer is desired or expected.   Abnormal findings (laboratory, x-ray, pathologic, and other diagnostic results) are not coded and reported unless the physician indicates their clinical significance. The medical record reflects the following clinical findings, please help. If possible by clarifying the diagnostic and/or clinical significance:      Component      Sodium  Latest Ref Rng      135 - 145 mEq/L  02/25/2013      136  02/28/2013     10:40 AM 130 (L)  02/28/2013     4:02 PM 133 (L)    Possible Clinical Conditions?  - Hyponatremia  - Mild hyponatremia  - Other condition (please document in the progress notes and/or discharge summary)  - Cannot Clinically determine at this time      Reviewed: additional documentation in the medical record   Thank You,  Saul Fordyce  Clinical Documentation Specialist: [phone] Health Information Management 

## 2013-03-02 NOTE — Progress Notes (Signed)
Agree with addition of stair goal.  [place] PT [phone]

## 2013-04-14 ENCOUNTER — Ambulatory Visit (INDEPENDENT_AMBULATORY_CARE_PROVIDER_SITE_OTHER): Payer: Medicare Other | Admitting: Neurology

## 2013-04-14 ENCOUNTER — Encounter: Payer: Self-pay | Admitting: Neurology

## 2013-04-14 VITALS — BP 136/77 | HR 75 | Temp 98.4°F | Ht 63.0 in | Wt 163.0 lb

## 2013-04-14 DIAGNOSIS — I10 Essential (primary) hypertension: Secondary | ICD-10-CM

## 2013-04-14 DIAGNOSIS — I619 Nontraumatic intracerebral hemorrhage, unspecified: Secondary | ICD-10-CM

## 2013-04-14 NOTE — Progress Notes (Signed)
[facility] [address] [place]. [place] [address] [phone]       OFFICE FOLLOW-UP NOTE  [name] Date of Birth:  [birth year] Medical Record Number:  [id number]   HPI:  65 year [place] male seen today for the first office follow visit for hospital admission for stroke on 02/25/13. He speaks limited [place] and his daughter who was present throughout this visit translates for him. He presented with sudden onset of headache and confusion. This apparently developed 3 days after his granddaughter died unexpectedly in [place] and he got a telephone call for this. 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 His blood pressure was elevated. He was kept in the intensive care unit and blood pressure was aggressively treated. He did quite well and was able to swallow. He was seen with physical occupational therapy and remained stable on followup CT head. He had only mild diminished right-sided fine motor skills and right lower facial weakness which is likely old from a previous stroke from 2 years ago. Was discharged home to the care of his family has done well and has obtained substantial improvement. The daughter states that she's noticed some memory difficulties and cognitive decline since the hemorrhage. His gait and walking and fine motor skills seem to be improving. His blood pressure is also much better and it is usually in the 130s at home. He is also been driving to a limited degree. He does have a prior history of left brain subcortical infarct 2 years ago and residual right lower facial weakness from that. He did not been taking any aspirin since his stroke  ROS:   14 system review of systems is positive for  weight loss, fatigue, shortness of breath, cough, wheezing, feeling hot and cold, memory  loss, headache, weakness, dizziness, depression, and decreased energy, appetite and is interested in activities and sleepiness PMH:  Past Medical History  Diagnosis Date  . Hypertension   . Stroke     Social History:  History   Social History  . Marital Status: Married    Spouse Name: N/A    Number of Children: N/A  . Years of Education: N/A   Occupational History  . Not on file.   Social History Main Topics  . Smoking status: Former Smoker -- 0.00 packs/day    Types: Cigarettes  . Smokeless tobacco: Not on file  . Alcohol Use: No  . Drug Use: Not on file  . Sexual Activity: Not on file   Other Topics Concern  . Not on file   Social History Narrative  . No narrative on file    Medications:   Current Outpatient Prescriptions on File Prior to Visit  Medication Sig Dispense Refill  . acetaminophen (TYLENOL) 500 MG tablet Take 1,000 mg by mouth daily as needed (for headache).      Marland Kitchen albuterol (PROVENTIL HFA;VENTOLIN HFA) 108 (90 BASE) MCG/ACT inhaler Inhale 2 puffs into the lungs every 6 (six) hours as needed for wheezing or shortness of breath.  1 Inhaler  1  . atorvastatin (LIPITOR) 10 MG tablet Take 1 tablet (10 mg total) by mouth daily at 6 PM.  30 tablet  1  . benzonatate (TESSALON) 100 MG capsule Take 100 mg by mouth 3 (three) times daily as needed for cough.      Marland Kitchen  lisinopril (PRINIVIL,ZESTRIL) 5 MG tablet Take 1 tablet (5 mg total) by mouth daily.  30 tablet  2   No current facility-administered medications on file prior to visit.    Allergies:  No Known Allergies  Physical Exam General: well developed, well nourished, seated, in no evident distress Head: head normocephalic and atraumatic. Orohparynx benign Neck: supple with no carotid or supraclavicular bruits Cardiovascular: regular rate and rhythm, no murmurs Musculoskeletal: no deformity Skin:  no rash/petichiae Vascular:  Normal pulses all extremities Filed Vitals:   04/14/13 1541  BP: 136/77   Pulse: 75  Temp: 98.4 F (36.9 C)    Neurologic Exam Mental Status: limited due to language barrier and daughter translates. Awake and fully alert. Oriented to place and time. Recent and remote memory intact.  . Mood and affect appropriate.  Cranial Nerves: Fundoscopic exam reveals sharp disc margins. Pupils equal, briskly reactive to light. Extraocular movements full without nystagmus. Visual fields full to confrontation. Hearing intact. Facial sensation intact. Mild right lower face weakness, tongue, palate moves normally and symmetrically.  Motor: Normal bulk and tone. Normal strength in all tested extremity muscles. Sensory.: intact to tough and pinprick and vibratory.  Coordination: Rapid alternating movements normal in all extremities. Finger-to-nose and heel-to-shin performed accurately bilaterally. Gait and Station: Arises from chair without difficulty. Stance is skightly broad based. Gait demonstrates normal stride length and balance . Not able to heel, toe and tandem walk without difficulty.  Reflexes: 1+ and symmetric. Toes downgoing.   NIHSS 1  Modified Rankin  2  ASSESSMENT: 71 year [place] male who had right thalamic intracerebral hemorrhage in August 2014 secondary to hypertension who seems to be doing quite well with only mild memory and cognitive difficulties. Prior history of left brain subcortical infarct 2 years ago.   PLAN: Start baby aspirin 81 mg daily for stroke prevention. Maintain strict control of hypertension with blood pressure goal below 130/90 and lipids with LDL cholesterol goal below 100 mg percent. I have advised him to limit his driving. Return for followup in 4 months with Lynn,NP or. call earlier if necessary.

## 2013-04-14 NOTE — Patient Instructions (Addendum)
Start baby aspirin 81 mg daily for stroke prevention. Maintain strict control of hypertension with blood pressure goal below 130/90 and lipids with LDL cholesterol goal below 100 mg percent. I have advised him to limit his driving. Return for followup in 4 months with Lynn,NP or. call earlier if necessary.

## 2013-05-20 ENCOUNTER — Other Ambulatory Visit: Payer: Self-pay

## 2013-07-27 ENCOUNTER — Encounter: Payer: Self-pay | Admitting: Nurse Practitioner

## 2013-07-29 ENCOUNTER — Ambulatory Visit (HOSPITAL_COMMUNITY)
Admission: RE | Admit: 2013-07-29 | Discharge: 2013-07-29 | Disposition: A | Discharge status: Home / Self Care | Payer: Medicare Other | Source: Ambulatory Visit | Attending: Internal Medicine | Admitting: Internal Medicine

## 2013-07-29 ENCOUNTER — Other Ambulatory Visit (HOSPITAL_COMMUNITY): Payer: Self-pay | Admitting: Internal Medicine

## 2013-07-29 DIAGNOSIS — J209 Acute bronchitis, unspecified: Secondary | ICD-10-CM

## 2013-07-29 DIAGNOSIS — I517 Cardiomegaly: Secondary | ICD-10-CM | POA: Insufficient documentation

## 2013-07-29 DIAGNOSIS — J42 Unspecified chronic bronchitis: Secondary | ICD-10-CM | POA: Insufficient documentation

## 2013-08-17 ENCOUNTER — Ambulatory Visit: Payer: Medicare Other | Admitting: Nurse Practitioner

## 2013-08-25 ENCOUNTER — Ambulatory Visit: Payer: Medicare Other | Admitting: Nurse Practitioner

## 2013-08-26 ENCOUNTER — Telehealth: Payer: Self-pay | Admitting: Nurse Practitioner

## 2013-08-26 NOTE — Telephone Encounter (Signed)
Patient was no show for appointment

## 2014-09-12 ENCOUNTER — Emergency Department (HOSPITAL_COMMUNITY)
Admission: EM | Admit: 2014-09-12 | Discharge: 2014-09-12 | Disposition: A | Discharge status: Home / Self Care | Payer: Medicare Other | Attending: Emergency Medicine | Admitting: Emergency Medicine

## 2014-09-12 ENCOUNTER — Emergency Department (HOSPITAL_COMMUNITY): Payer: Medicare Other

## 2014-09-12 ENCOUNTER — Encounter (HOSPITAL_COMMUNITY): Payer: Self-pay | Admitting: Family Medicine

## 2014-09-12 DIAGNOSIS — R531 Weakness: Secondary | ICD-10-CM | POA: Insufficient documentation

## 2014-09-12 DIAGNOSIS — Y998 Other external cause status: Secondary | ICD-10-CM | POA: Diagnosis not present

## 2014-09-12 DIAGNOSIS — Z8673 Personal history of transient ischemic attack (TIA), and cerebral infarction without residual deficits: Secondary | ICD-10-CM | POA: Insufficient documentation

## 2014-09-12 DIAGNOSIS — S8991XA Unspecified injury of right lower leg, initial encounter: Secondary | ICD-10-CM | POA: Diagnosis not present

## 2014-09-12 DIAGNOSIS — S3992XA Unspecified injury of lower back, initial encounter: Secondary | ICD-10-CM | POA: Diagnosis not present

## 2014-09-12 DIAGNOSIS — T148 Other injury of unspecified body region: Secondary | ICD-10-CM | POA: Diagnosis not present

## 2014-09-12 DIAGNOSIS — Y9289 Other specified places as the place of occurrence of the external cause: Secondary | ICD-10-CM | POA: Insufficient documentation

## 2014-09-12 DIAGNOSIS — R51 Headache: Secondary | ICD-10-CM | POA: Insufficient documentation

## 2014-09-12 DIAGNOSIS — Z79899 Other long term (current) drug therapy: Secondary | ICD-10-CM | POA: Diagnosis not present

## 2014-09-12 DIAGNOSIS — I10 Essential (primary) hypertension: Secondary | ICD-10-CM | POA: Insufficient documentation

## 2014-09-12 DIAGNOSIS — X58XXXA Exposure to other specified factors, initial encounter: Secondary | ICD-10-CM | POA: Insufficient documentation

## 2014-09-12 DIAGNOSIS — Y9389 Activity, other specified: Secondary | ICD-10-CM | POA: Insufficient documentation

## 2014-09-12 DIAGNOSIS — Z87891 Personal history of nicotine dependence: Secondary | ICD-10-CM | POA: Diagnosis not present

## 2014-09-12 DIAGNOSIS — W19XXXA Unspecified fall, initial encounter: Secondary | ICD-10-CM

## 2014-09-12 DIAGNOSIS — R05 Cough: Secondary | ICD-10-CM | POA: Diagnosis not present

## 2014-09-12 DIAGNOSIS — D72829 Elevated white blood cell count, unspecified: Secondary | ICD-10-CM | POA: Diagnosis not present

## 2014-09-12 DIAGNOSIS — S0990XA Unspecified injury of head, initial encounter: Secondary | ICD-10-CM | POA: Diagnosis not present

## 2014-09-12 DIAGNOSIS — M545 Low back pain: Secondary | ICD-10-CM | POA: Diagnosis not present

## 2014-09-12 LAB — COMPREHENSIVE METABOLIC PANEL
ALT: 11 U/L (ref 0–53)
AST: 26 U/L (ref 0–37)
Albumin: 3.5 g/dL (ref 3.5–5.2)
Alkaline Phosphatase: 49 U/L (ref 39–117)
Anion gap: 4 — ABNORMAL LOW (ref 5–15)
BUN: 12 mg/dL (ref 6–23)
CALCIUM: 8.8 mg/dL (ref 8.4–10.5)
CO2: 27 mmol/L (ref 19–32)
CREATININE: 1.18 mg/dL (ref 0.50–1.35)
Chloride: 103 mmol/L (ref 96–112)
GFR calc Af Amer: 69 mL/min — ABNORMAL LOW (ref >90)
GFR calc non Af Amer: 60 mL/min — ABNORMAL LOW (ref >90)
GLUCOSE: 92 mg/dL (ref 70–99)
Potassium: 4.2 mmol/L (ref 3.5–5.1)
SODIUM: 134 mmol/L — AB (ref 135–145)
TOTAL PROTEIN: 7.7 g/dL (ref 6.0–8.3)
Total Bilirubin: 1.2 mg/dL (ref 0.3–1.2)

## 2014-09-12 LAB — CBC WITH DIFFERENTIAL/PLATELET
BASOS ABS: 0 10*3/uL (ref 0.0–0.1)
Basophils Relative: 0 % (ref 0–1)
EOS ABS: 0 10*3/uL (ref 0.0–0.7)
EOS PCT: 0 % (ref 0–5)
HCT: 43 % (ref 39.0–52.0)
Hemoglobin: 14.5 g/dL (ref 13.0–17.0)
LYMPHS ABS: 2.1 10*3/uL (ref 0.7–4.0)
Lymphocytes Relative: 19 % (ref 12–46)
MCH: 30 pg (ref 26.0–34.0)
MCHC: 33.7 g/dL (ref 30.0–36.0)
MCV: 88.8 fL (ref 78.0–100.0)
Monocytes Absolute: 1.1 10*3/uL — ABNORMAL HIGH (ref 0.1–1.0)
Monocytes Relative: 10 % (ref 3–12)
Neutro Abs: 8.2 10*3/uL — ABNORMAL HIGH (ref 1.7–7.7)
Neutrophils Relative %: 71 % (ref 43–77)
PLATELETS: 300 10*3/uL (ref 150–400)
RBC: 4.84 MIL/uL (ref 4.22–5.81)
RDW: 13.3 % (ref 11.5–15.5)
WBC: 11.5 10*3/uL — AB (ref 4.0–10.5)

## 2014-09-12 LAB — URINALYSIS, ROUTINE W REFLEX MICROSCOPIC
BILIRUBIN URINE: NEGATIVE
GLUCOSE, UA: NEGATIVE mg/dL
HGB URINE DIPSTICK: NEGATIVE
KETONES UR: NEGATIVE mg/dL
Leukocytes, UA: NEGATIVE
Nitrite: NEGATIVE
PH: 6.5 (ref 5.0–8.0)
PROTEIN: NEGATIVE mg/dL
Specific Gravity, Urine: 1.02 (ref 1.005–1.030)
Urobilinogen, UA: 1 mg/dL (ref 0.0–1.0)

## 2014-09-12 LAB — LIPASE, BLOOD: LIPASE: 28 U/L (ref 11–59)

## 2014-09-12 NOTE — ED Notes (Signed)
Pt here for fall. Complaining of lumbar pain. Pt in c collar.

## 2014-09-12 NOTE — ED Provider Notes (Signed)
CSN: [id number]     Arrival date & time 09/12/14  1527 History   First MD Initiated Contact with Patient 09/12/14 1531     Chief Complaint  Patient presents with  . Fall     (Consider location/radiation/quality/duration/timing/severity/associated sxs/prior Treatment) HPI Patient presents after possible fall. He has history of multiple strokes, including one within the past 6 months. Sequelae, the patient has developed memory loss. He is here with his daughter and wife. Patient denies fall, but the wife states that she was told patient did indeed fall. Patient complains of pain in his mid lower back, denies head or neck pain. He has ongoing weakness on the right, primarily distally in the leg. He has ongoing gait difficulty. Family states that the patient has become increasingly somnolent, distracted, less interactive in the past days. No report of new fever, cough, urinary changes. Daughter acted as Equities trader.  Past Medical History  Diagnosis Date  . Hypertension   . Stroke    History reviewed. No pertinent past surgical history. History reviewed. No pertinent family history. History  Substance Use Topics  . Smoking status: Former Smoker -- 0.00 packs/day    Types: Cigarettes  . Smokeless tobacco: Not on file  . Alcohol Use: No    Review of Systems  Constitutional:       Per HPI, otherwise negative  HENT:       Per HPI, otherwise negative  Respiratory:       Per HPI, otherwise negative  Cardiovascular:       Per HPI, otherwise negative  Gastrointestinal: Negative for vomiting.  Endocrine:       Negative aside from HPI  Genitourinary:       Neg aside from HPI   Musculoskeletal:       Per HPI, otherwise negative  Skin: Negative.   Neurological: Negative for syncope.      Allergies  Review of patient's allergies indicates no known allergies.  Home Medications   Prior to Admission medications   Medication Sig Start Date End Date Taking? Authorizing  Provider  acetaminophen (TYLENOL) 500 MG tablet Take 1,000 mg by mouth daily as needed (for headache).    Historical Provider, MD  albuterol (PROVENTIL HFA;VENTOLIN HFA) 108 (90 BASE) MCG/ACT inhaler Inhale 2 puffs into the lungs every 6 (six) hours as needed for wheezing or shortness of breath. 02/27/13   David L Rinehuls, PA-C  atorvastatin (LIPITOR) 10 MG tablet Take 1 tablet (10 mg total) by mouth daily at 6 PM. 02/27/13   David L Rinehuls, PA-C  benzonatate (TESSALON) 100 MG capsule Take 100 mg by mouth 3 (three) times daily as needed for cough.    Historical Provider, MD  lisinopril (PRINIVIL,ZESTRIL) 5 MG tablet Take 1 tablet (5 mg total) by mouth daily. 02/27/13   David L Rinehuls, PA-C  Multiple Vitamins-Minerals (ALIVE MENS ENERGY PO) Take 2 capsules by mouth daily.    Historical Provider, MD  sertraline (ZOLOFT) 50 MG tablet Take 50 mg by mouth daily.    Historical Provider, MD   BP 160/90 mmHg  Pulse 93  Temp(Src) 98.3 F (36.8 C)  Resp 22  SpO2 99% Physical Exam  Constitutional: He is oriented to person, place, and time. He appears well-developed. No distress.  HENT:  Head: Normocephalic and atraumatic.  Eyes: Conjunctivae and EOM are normal.  Cardiovascular: Normal rate and regular rhythm.   Pulmonary/Chest: Effort normal. No stridor. No respiratory distress.  Abdominal: He exhibits no distension.  Musculoskeletal: He exhibits no  edema.  Neurological: He is alert and oriented to person, place, and time.  Minimal strength deficiency in the right lower extremity compared to the left. Patient moves all extremity spontaneously, speech is clear (Montagnard), face is symmetric.  Skin: Skin is warm and dry.  Psychiatric: He has a normal mood and affect.  Nursing note and vitals reviewed.   ED Course  Procedures (including critical care time) Labs Review Labs Reviewed  LIPASE, BLOOD  COMPREHENSIVE METABOLIC PANEL  CBC WITH DIFFERENTIAL/PLATELET  URINALYSIS, ROUTINE W REFLEX  MICROSCOPIC    Imaging Review Dg Chest 2 View  09/12/2014   CLINICAL DATA:  Status post fall. Cough. Difficulty breathing. Initial encounter.  EXAM: CHEST  2 VIEW  COMPARISON:  Chest radiograph performed 07/29/2013  FINDINGS: The lungs are well-aerated. Mild peribronchial thickening is noted. There is mild elevation of the left hemidiaphragm. There is no evidence of focal opacification, pleural effusion or pneumothorax.  The heart is borderline enlarged. No acute osseous abnormalities are seen.  IMPRESSION: Mild peribronchial thickening noted; lungs otherwise clear. Borderline cardiomegaly.   Electronically Signed   By: Roanna RaiderJeffery  Chang M.D.   On: 09/12/2014 17:14   Ct Head Wo Contrast  09/12/2014   CLINICAL DATA:  Fall, headache.  EXAM: CT HEAD WITHOUT CONTRAST  TECHNIQUE: Contiguous axial images were obtained from the base of the skull through the vertex without intravenous contrast.  COMPARISON:  CT 02/27/2013  FINDINGS: No acute intracranial hemorrhage. No focal mass lesion. No CT evidence of acute infarction. No midline shift or mass effect. No hydrocephalus. Basilar cisterns are patent.  Interval resolution the intraparenchymal hemorrhage in the right basal ganglia.  There is extensive periventricular white matter hypodensities. There is generalized cortical atrophy. Mild ventricular dilatation proportional to cortical atrophy.  IMPRESSION: 1. No intracranial trauma. 2. Generalized cortical atrophy and extensive white matter microvascular disease.   Electronically Signed   By: Genevive BiStewart  Edmunds M.D.   On: 09/12/2014 17:07     Update: Patient in no distress. MDM  Patient presents after a possible fall. Patient has history of multiple prior strokes, consequently has a difficulty with memory. Here the patient is awake, alert, with no focal neuro deficits. Patient's vitals. Patient's labs notable for mild leukocytosis, otherwise unremarkable.   Gerhard Munchobert Adaisha Campise, MD 09/12/14 2113

## 2014-09-12 NOTE — Discharge Instructions (Signed)
As discussed, your evaluation today has been largely reassuring.  But, it is important that you monitor your condition carefully, and do not hesitate to return to the ED if you develop new, or concerning changes in your condition.  Otherwise, please follow-up with your physician for appropriate ongoing care.   Nh? ? th?o lu?n, ?nh gi hi?n nay c?a b?n ? ???c ph?n l?n yn tm. Nh?ng, ?i?u quan tr?ng l b?n theo di tnh tr?ng c?a b?n m?t cch c?n th?n, v khng ng?n ng?i ?? tr? v? ED n?u b?n pht tri?n m?i, ho?c lin quan ??n nh?ng thay ??i v? tnh tr?ng c?a b?n.  N?u khng, hy theo di v?i bc s? ?? ???c ch?m [place] lin t?c thch h?p.

## 2014-09-19 DIAGNOSIS — I619 Nontraumatic intracerebral hemorrhage, unspecified: Secondary | ICD-10-CM | POA: Diagnosis not present

## 2014-09-19 DIAGNOSIS — J449 Chronic obstructive pulmonary disease, unspecified: Secondary | ICD-10-CM | POA: Diagnosis not present

## 2014-09-19 DIAGNOSIS — I10 Essential (primary) hypertension: Secondary | ICD-10-CM | POA: Diagnosis not present

## 2014-09-19 DIAGNOSIS — E784 Other hyperlipidemia: Secondary | ICD-10-CM | POA: Diagnosis not present

## 2014-09-19 DIAGNOSIS — R7301 Impaired fasting glucose: Secondary | ICD-10-CM | POA: Diagnosis not present

## 2014-09-19 DIAGNOSIS — F070 Personality change due to known physiological condition: Secondary | ICD-10-CM | POA: Diagnosis not present

## 2014-10-04 DIAGNOSIS — R410 Disorientation, unspecified: Secondary | ICD-10-CM | POA: Diagnosis not present

## 2014-10-04 DIAGNOSIS — I639 Cerebral infarction, unspecified: Secondary | ICD-10-CM | POA: Diagnosis not present

## 2014-10-04 DIAGNOSIS — R51 Headache: Secondary | ICD-10-CM | POA: Diagnosis not present

## 2014-10-04 DIAGNOSIS — H539 Unspecified visual disturbance: Secondary | ICD-10-CM | POA: Diagnosis not present

## 2014-10-27 ENCOUNTER — Ambulatory Visit: Payer: Medicare Other | Admitting: Speech Pathology

## 2014-10-27 ENCOUNTER — Encounter: Payer: Self-pay | Admitting: Occupational Therapy

## 2014-10-27 ENCOUNTER — Ambulatory Visit: Payer: Medicare Other | Attending: Internal Medicine | Admitting: Occupational Therapy

## 2014-10-27 DIAGNOSIS — G8191 Hemiplegia, unspecified affecting right dominant side: Secondary | ICD-10-CM | POA: Diagnosis present

## 2014-10-27 DIAGNOSIS — I69351 Hemiplegia and hemiparesis following cerebral infarction affecting right dominant side: Secondary | ICD-10-CM | POA: Diagnosis not present

## 2014-10-27 DIAGNOSIS — R41841 Cognitive communication deficit: Secondary | ICD-10-CM

## 2014-10-27 DIAGNOSIS — I10 Essential (primary) hypertension: Secondary | ICD-10-CM | POA: Diagnosis not present

## 2014-10-27 DIAGNOSIS — E784 Other hyperlipidemia: Secondary | ICD-10-CM | POA: Diagnosis not present

## 2014-10-27 DIAGNOSIS — R7301 Impaired fasting glucose: Secondary | ICD-10-CM | POA: Diagnosis not present

## 2014-10-27 DIAGNOSIS — F329 Major depressive disorder, single episode, unspecified: Secondary | ICD-10-CM | POA: Diagnosis not present

## 2014-10-27 DIAGNOSIS — I6789 Other cerebrovascular disease: Secondary | ICD-10-CM | POA: Diagnosis not present

## 2014-10-27 NOTE — Patient Instructions (Signed)
Get large calendar, place in prominent spot and cross off days

## 2014-10-27 NOTE — Therapy (Signed)
[facility] [address] [place], [place], [address] Phone: [phone]   Fax:  [phone]  Occupational Therapy Evaluation  Patient Details  Name: Kurt Lewis MRN: [id number] Date of Birth: 1941/03/13 Referring Provider:  Fleet ContrasAvbuere, Edwin, MD  Encounter Date: 10/27/2014      OT End of Session - 10/27/14 1353    Visit Number 1   Number of Visits 8   Date for OT Re-Evaluation 11/24/14   OT Start Time 1147   OT Stop Time 1220   OT Time Calculation (min) 33 min   Activity Tolerance Patient tolerated treatment well      Past Medical History  Diagnosis Date  . Hypertension   . Stroke     History reviewed. No pertinent past surgical history.  There were no vitals filed for this visit.  Visit Diagnosis:  Hemiplegia affecting right dominant side - Plan: Ot plan of care cert/re-cert      Subjective Assessment - 10/27/14 1155    Subjective  My father fell and the dr wanted him to be seen by therapy   Pertinent History see epic snapshot;  CVA in 2011 and 2014;  daughter states reason for referral today is that pt fell.   Currently in Pain? No/denies           [facility] - 10/27/14 1157    Assessment   Diagnosis CVA   Onset Date --  2011 and 2014;  pt fell 06/2014 and 08/2014   Precautions   Precautions None   Restrictions   Weight Bearing Restrictions No   Balance Screen   Has the patient fallen in the past 6 months Yes   How many times? 2  2 witnessed falls; dtr feels he likely had more falls   Home  Environment   Family/patient expects to be discharged to: Private residence   Living Arrangements Spouse/significant other   Available Help at Discharge Available 24 hours/day  wife is there 24/7   Home Layout One level   Bathroom Shower/Tub Tub/Shower unit   Prior Function   Level of Independence Independent with basic ADLs;Independent with homemaking with ambulation  pt was I prior to  stroke   ADL   Eating/Feeding Independent   Grooming Supervision/safety   Upper Body Bathing Modified independent  occassionally needs cues to shower from wife   Lower Body Bathing Modified independent   Upper Body Dressing --  mod I    Lower Body Dressing Modified independent   Toilet Tranfer Modified independent   Toileting - Clothing Manipulation Modified independent   Toileting -  Hygiene Modified Independent   Tub/Shower Transfer Independent   IADL   Shopping Takes care of all shopping needs independently   Light Housekeeping Maintains house alone or with occasional assistance   Meal Prep Plans, prepares and serves adequate meals independently   Community Mobility Relies on family or friends for transportation  pt able to walk to most places he needs to go unassisted   Medication Management Has difficulty remembering to take medication   Financial Management Dependent   Mobility   Mobility Status History of falls   Written Expression   Dominant Hand Right   Handwriting 100% legible  for name   Vision - History   Baseline Vision Wears glasses all the time   Vision Assessment   Comment daughter reports no changes in vision.   Cognition   Overall Cognitive Status Impaired/Different from baseline  pt with times of  confusion and paranoia   Sensation   Light Touch Appears Intact   Hot/Cold Appears Intact   Proprioception Appears Intact   Coordination   Gross Motor Movements are Fluid and Coordinated Yes   Fine Motor Movements are Fluid and Coordinated No  mildly impaired   9 Hole Peg Test Right   Right 9 Hole Peg Test 45.41   Box and Blocks R=30   ROM / Strength   AROM / PROM / Strength AROM;Strength   AROM   Overall AROM  Within functional limits for tasks performed   Strength   Overall Strength Within functional limits for tasks performed   Hand Function   Right Hand Gross Grasp Impaired   Right Hand Grip (lbs) 20   Left Hand Grip (lbs) 55                               OT Long Term Goals - 2014/11/01 1358    OT LONG TERM GOAL #1   Title Pt and family will be mod I with HEP - 11/24/2014   Status New   OT LONG TERM GOAL #2   Title Pt will increase strength by 5 pounds of grip for R hand to assist with functional tasks - baseline 20 pounds   Status New   OT LONG TERM GOAL #3   Title PT will decrease time on 9 hole peg as evidencd of improved coordination by at least 5 seconds to assist with functional tasks - baseline 45.41   Status New   OT LONG TERM GOAL #4   Title Pt will be able to write at sentence length with 100% legibility   Status New               Plan - 2014-11-01 1354    Clinical Impression Statement Pt is 74 year old male s/p CVA in 2011 and 2014 with recent falls who presents today with the folllowing deficits that impact his ability for functional use of his dominant hand:  right dominant hemiplegia, decreasd strength R hand, decreased coordinaton R hand. Pt also with cognitive impairment and reported periods of confusion and paranoia per daughter that appears to be possibly demetia in nature.  ST to address cogntion with pt and wife.   Rehab Potential Fair   Clinical Impairments Affecting Rehab Potential Given cognitive deficits and length of time since last stroke, will trial 4 weeks of OT to try and improve strength and coordination of dominant hand.   OT Frequency 2x / week   OT Duration 4 weeks   OT Treatment/Interventions Self-care/ADL training;Fluidtherapy;DME and/or AE instruction;Neuromuscular education;Therapeutic exercise;Manual Therapy;Therapeutic activities;Patient/family education   Plan intiate HEP   Consulted and Agree with Plan of Care Patient;Family member/caregiver   Family Member Consulted daughter;  interpreter also with patient          G-Codes - 2014/11/01 1551    Functional Limitation Carrying, moving and handling objects   Carrying, Moving and Handling Objects  Current Status [phone]) At least 60 percent but less than 80 percent impaired, limited or restricted   Carrying, Moving and Handling Objects Goal Status (U0454) At least 40 percent but less than 60 percent impaired, limited or restricted      Problem List Patient Active Problem List   Diagnosis Date Noted  . 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 Headache(784.0) 03/01/2013  . Encounter for long-term (current) use of medications 03/01/2013  . Acute delirium 03/01/2013  .  Hemorrhage, intracranial 02/27/2013  . Malignant hypertension 02/27/2013  . Other and unspecified hyperlipidemia 02/27/2013  . Noncompliance with medication regimen 02/27/2013  . Unspecified asthma(493.90) 02/27/2013  . Essential hypertension, benign 02/27/2013    Norton Pastel, OTR/L 10/27/2014, 3:54 PM  [facility] [address] [place], [place], [address] Phone: [phone]   Fax:  [phone]

## 2014-10-27 NOTE — Therapy (Signed)
[facility] [address] [place], [place], [address] Phone: [phone]   Fax:  [phone]  Speech Language Pathology Evaluation  Patient Details  Name: Kurt BlamerUnknown Kurt Lewis MRN: [id number] Date of Birth: Jan 21, 1941 Referring Provider:  Fleet ContrasAvbuere, Edwin, MD  Encounter Date: 10/27/2014      End of Session - 10/27/14 1206    Date for SLP Re-Evaluation 11/24/14      Past Medical History  Diagnosis Date  . Hypertension   . Stroke     No past surgical history on file.  There were no vitals filed for this visit.  Visit Diagnosis: Cognitive communication deficit      Subjective Assessment - 10/27/14 1123    Subjective "He has problems with his memory"   Patient is accompained by: Family member   Currently in Pain? No/denies            [facility] - 10/27/14 1123    SLP Visit Information   SLP Received On 10/27/14   Onset Date Pt has had 2 CVA's. 1st in 06/2010 with some residual right side weakness, however pt remained independent. Last CVA  was 02/2013. Pt experienced memory and cogntive problems after his 2nd CVA. He has not driven since 05/2013. Pt has had progressive memory issues which have worsened since 06/2014 in combination with falls. His daughter reports that he has required assistance with meds since 2014 and has recently noted some paranoia with not wanting to take meds.  He does wander and get lost at times, but is know in his community and is helped home.   General Information   HPI see onset date   Mobility Status walks independently   Prior Functional Status   Cognitive/Linguistic Baseline Baseline deficits   Baseline deficit details H/o right side weakness since CVA 2011. Cognitive impairments of memory, safety awareness, problem solving since CVA 2014. Worsening memory, confusion since Dec 2015 per daughter.     Lives With Spouse   Available Support Family;Friend(s)   Vocation Retired   Therapist, musicCognition   Attention Selective   Selective Attention Impaired   Memory Impaired   Memory Impairment Decreased recall of new information;Decreased short term memory   Awareness Impaired   Problem Solving Impaired   Astronomerxecutive Function Reasoning;Sequencing;Organizing;Decision Making   Reasoning Impaired              SLP Education - 10/27/14 1154    Education provided Yes   Education Details goals, compensatory strategies for memory   Person(s) Educated Patient;Child(ren)   Methods Explanation   Comprehension Verbalized understanding;Need further instruction            SLP Long Term Goals - 10/27/14 1225    SLP LONG TERM GOAL #1   Title Family will implement  external aid for orientation and report daily use over 3 sessions   Baseline 11/24/14   Time 4   Period Weeks   Status New   SLP LONG TERM GOAL #2   Title Family will verbalize 3 cognitive enhancing activities pt can do at home and report carryover of these 3-5x a week over 3 sessions   Baseline 11/24/14   Period Weeks   SLP LONG TERM GOAL #3   Title Family will implement external aids managment of pt's belongings, money , meds and report daily carryover over 3 sessions (11/24/14)   Time 4   Period Weeks   Status New          Plan - 10/27/14  1156    Clinical Impression Statement 74 y.o. male refered by PCP due to increasing cogntitive impairments. Kurt Lewis has had 2 previous CVA's in 2009-12-11 and Dec 11, 2012. According to his daughter, Kurt Lewis's memory and cognition were affected after his 2nd CVA. He has required assistance with medication management and has not been driving since this last CVA. According to Kurt Lewis's daughter, he has become more confused since 06/2014. He has had increasing difficulty managing his money, has been wandering, loosing house keys and has seemed "paranoid" not wanting to take meds or thinking his wife is taking his money. He has had severa lfalls since 06/2014. The daughter Kurt Lewis states that [name] has varying moods and can be pleasant then switch to anger rapidly for no obvious reason. With these reports, I ? some level of dementia as well as prior cognitive dificits due to old CVA. Kurt Lewis has a neurology appointment this June. He did not solve simple time or money problmes today. He did not accurately  verbally sequence ADL's or verbally solve problems (ie"What would you do if you couldn't find your toothbrush). He pesents today with moderate memory and problem solving imapirment. I recommend skilled ST short term  for implementation and  carryover of compensatory strategies in the home to reduce caregiver burden. Kurt Lewis lives with his wife and has 3 children in the area. Interpreter was present and assisted during this evaluation.   Speech Therapy Frequency 2x / week   Duration 4 weeks   Treatment/Interventions Compensatory strategies;Patient/family education;Internal/external aids;SLP instruction and feedback   Potential to Achieve Goals Fair   Potential Considerations Ability to learn/carryover information;Previous level of function;Severity of impairments;Cooperation/participation level   Consulted and Agree with Plan of Care Patient;Family member/caregiver   Family Member Consulted daugher          G-Codes - Nov 24, 2014 1206-12-12    Functional Limitations Memory   Memory Current Status [phone]) At least 40 percent but less than 60 percent impaired, limited or restricted   Memory Goal Status (U0454) At least 40 percent but less than 60 percent impaired, limited or restricted      Problem List Patient Active Problem List   Diagnosis Date Noted  . 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 Headache(784.0) 03/01/2013  . Encounter for long-term (current) use of medications 03/01/2013  . Acute delirium 03/01/2013  . Hemorrhage, intracranial 02/27/2013  . Malignant hypertension 02/27/2013  . Other and unspecified hyperlipidemia 02/27/2013  . Noncompliance with medication regimen 02/27/2013  .  Unspecified asthma(493.90) 02/27/2013  . Essential hypertension, benign 02/27/2013    Lovvorn, Radene Journey, SLP 11-24-2014, 12:26 PM   [facility] [address] [place], [place], [address] Phone: [phone]   Fax:  [phone]

## 2014-11-10 ENCOUNTER — Ambulatory Visit: Payer: Medicare Other | Admitting: Speech Pathology

## 2014-11-10 ENCOUNTER — Ambulatory Visit: Payer: Medicare Other | Admitting: Occupational Therapy

## 2014-11-10 DIAGNOSIS — R41841 Cognitive communication deficit: Secondary | ICD-10-CM

## 2014-11-10 DIAGNOSIS — G8191 Hemiplegia, unspecified affecting right dominant side: Secondary | ICD-10-CM

## 2014-11-10 DIAGNOSIS — I69351 Hemiplegia and hemiparesis following cerebral infarction affecting right dominant side: Secondary | ICD-10-CM | POA: Diagnosis not present

## 2014-11-10 NOTE — Patient Instructions (Addendum)
  Consider using large labels and boxes for items Valora PiccoloYap is misplacing, such as keys, wallet, money, and keep them in the same place. He will likely require verbal reminders to use these boxes and that's OK.  Also consider using signs or labels to help Valora PiccoloYap remember to "Lock the Door" or "Turn off the stove"   Engage him in memory activities at home such as card games, checkers, etc

## 2014-11-10 NOTE — Patient Instructions (Addendum)
  Coordination Activities  Perform the following activities for 20 minutes 1-2 times per day with right hand(s).   Rotate ball in fingertips (clockwise and counter-clockwise).  Toss ball between hands.  Toss ball in air and catch with the same hand.  Flip cards 1 at a time as fast as you can.  Deal cards with your thumb (Hold deck in hand and push card off top with thumb).  Pick up coins, buttons, marbles, dried beans/pasta of different sizes and place in container.  Pick up coins and place in container or coin bank.  Pick up coins and stack.  Pick up coins one at a time until you get 5-10 in your hand, then move coins from palm to fingertips to stack one at a time.      1. Grip Strengthening (Resistive Putty)   Squeeze putty using thumb and all fingers. Repeat _20___ times. Do __2__ sessions per day.   2. Roll putty into tube on table and pinch between each finger and thumb x 10 reps each. (can do ring and small finger together)     Copyright  VHI. All rights reserved.

## 2014-11-10 NOTE — Therapy (Signed)
[facility] [address] [place], [place], [address] Phone: [phone]   Fax:  [phone]  Occupational Therapy Treatment  Patient Details  Name: Kurt BlamerUnknown Kurt Lewis MRN: [id number] Date of Birth: 11-07-1940 Referring Provider:  Fleet ContrasAvbuere, Edwin, MD  Encounter Date: 11/10/2014      OT End of Session - 11/10/14 1030    Visit Number 2   Number of Visits 8   Date for OT Re-Evaluation 11/24/14   OT Start Time 1022   OT Stop Time 1100   OT Time Calculation (min) 38 min   Activity Tolerance Patient tolerated treatment well   Behavior During Therapy [facility] for tasks assessed/performed      Past Medical History  Diagnosis Date  . Hypertension   . Stroke     No past surgical history on file.  There were no vitals filed for this visit.  Visit Diagnosis:  Hemiplegia affecting right dominant side      Subjective Assessment - 11/10/14 1024    Subjective  Pt denies pain only reports weakness   Pertinent History see epic snapshot;  CVA in 2011 and 2014;  daughter states reason for referral today is that pt fell.   Currently in Pain? No/denies          Treatment: Pt was instructed in HEP for coordination and red putty exercises, via interpreter. Pt demonstrated min-mod difficulty with tasks yet , pts daughter verbalized understanding of assisting pt. Copying small peg design with RUE, min-mod difficulty, min v.c.                    OT Education - 11/10/14 1313    Education provided Yes   Education Details coordiantion/ putty HEP   Person(s) Educated Patient;Spouse;Child(ren)  via interpreter   Methods Explanation;Demonstration;Handout   Comprehension Returned demonstration;Verbalized understanding  Pt's daughter speaks [place]             OT Long Term Goals - 10/27/14 1358    OT LONG TERM GOAL #1   Title Pt and family will be mod I with HEP - 11/24/2014   Status New   OT LONG TERM GOAL #2   Title Pt will increase strength by 5 pounds of grip for R hand to assist with functional tasks - baseline 20 pounds   Status New   OT LONG TERM GOAL #3   Title PT will decrease time on 9 hole peg as evidencd of improved coordination by at least 5 seconds to assist with functional tasks - baseline 45.41   Status New   OT LONG TERM GOAL #4   Title Pt will be able to write at sentence length with 100% legibility   Status New               Plan - 11/10/14 1051    Clinical Impression Statement Pt is progressing towards goals for coordination and grip strength.   Plan reinforce coordination HEP.   Consulted and Agree with Plan of Care Patient;Family member/caregiver   Family Member Consulted daughter;  interpreter also with patient        Problem List Patient Active Problem List   Diagnosis Date Noted  . 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 Headache(784.0) 03/01/2013  . Encounter for long-term (current) use of medications 03/01/2013  . Acute delirium 03/01/2013  . Hemorrhage, intracranial 02/27/2013  . Malignant hypertension 02/27/2013  . Other and unspecified hyperlipidemia 02/27/2013  . Noncompliance with medication regimen 02/27/2013  . Unspecified asthma(493.90) 02/27/2013  .  Essential hypertension, benign 02/27/2013    RINE,KATHRYN 11/10/2014, 1:15 PM Keene Breath, OTR/L [phone] Phone: [phone] 1:15 PM 11/10/2014 [facility] [address] [place], [place], [address] Phone: [phone]   Fax:  [phone]

## 2014-11-10 NOTE — Therapy (Signed)
[facility] [address] [place], [place], [address] Phone: [phone]   Fax:  [phone]  Speech Language Pathology Treatment  Patient Details  Name: Kurt Lewis MRN: [id number] Date of Birth: 07/01/1941 Referring Provider:  Fleet Contras, MD  Encounter Date: 11/10/2014      End of Session - 11/10/14 1030    Visit Number 2   Number of Visits 8   SLP Start Time 0930   SLP Stop Time  1014   SLP Time Calculation (min) 44 min   Activity Tolerance Patient tolerated treatment well      Past Medical History  Diagnosis Date  . Hypertension   . Stroke     No past surgical history on file.  There were no vitals filed for this visit.  Visit Diagnosis: Cognitive communication deficit      Subjective Assessment - 11/10/14 0942    Subjective "He forgets a lot of things"   Patient is accompained by: Family member   Currently in Pain? Yes   Pain Score --  did not understand pain scale with interpreter   Pain Location Knee   Pain Orientation Right   Pain Descriptors / Indicators Aching;Constant   Pain Type Chronic pain   Pain Onset More than a month ago               ADULT SLP TREATMENT - 11/10/14 0947    General Information   Behavior/Cognition Alert;Cooperative;Pleasant mood   Treatment Provided   Treatment provided Cognitive-Linquistic   Cognitive-Linquistic Treatment   Treatment focused on Cognition   Skilled Treatment Reviewed with pt memory strategies, however spouse reports memory is so impaired he won't remember. Facilitated memory/organzation strategies for home including labeling boxes for keys and money/wallet in same place with label. Also using sign on door to lock the door when he leaves. Spouse also reports pt leaving stove on and burning some pots. Pt became frustrated during this discussion. ST helped family identify memory/cognitive enhancing activities for pt to do at home. Pt and ST  demonstrated the memory game with deck of cards - pt participated in this game with rare min A. Also facilitated UNO, agian, pt played with occassional min A. Encouraged family to get deck of cards and engage pt in these types of activities.    Assessment / Recommendations / Plan   Plan Continue with current plan of care   Progression Toward Goals   Progression toward goals Progressing toward goals          SLP Education - 11/10/14 1023    Education provided Yes   Education Details environmental compensations for memory            SLP Long Term Goals - 11/10/14 1029    SLP LONG TERM GOAL #1   Title Family will implement  external aid for orientation and report daily use over 3 sessions   Time 3   Period Weeks   Status On-going   SLP LONG TERM GOAL #2   Title Family will verbalize 3 cognitive enhancing activities pt can do at home and report carryover of these 3-5x a week over 3 sessions   Time 3   Period Weeks   Status On-going   SLP LONG TERM GOAL #3   Title Family will implement external aids managment of pt's belongings, money , meds and report daily carryover over 3 sessions    Time 3   Period Weeks   Status On-going  Plan - 11/10/14 1024    Clinical Impression Statement Interpreter, spouse and daughter present for session.  Spouse and pt required usual mod A to identify environmental compensations for memory impairment and safety. Pt required min A to participate in cogntive enhancing card games. Continue skilled ST to reduce caregiver burden and assist safety  at home. I initated discussion ZO:XWRUEre:[facility]. Spouse stated transportation is a problem.  Progress will depend on spouse/family carry over of memory strategies at home.    Speech Therapy Frequency 2x / week   Duration 4 weeks   Treatment/Interventions Compensatory strategies;Patient/family education;Internal/external aids;SLP instruction and feedback   Potential Considerations Ability to  learn/carryover information;Previous level of function;Severity of impairments;Cooperation/participation level   Consulted and Agree with Plan of Care Patient;Family member/caregiver        Problem List Patient Active Problem List   Diagnosis Date Noted  . 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 Headache(784.0) 03/01/2013  . Encounter for long-term (current) use of medications 03/01/2013  . Acute delirium 03/01/2013  . Hemorrhage, intracranial 02/27/2013  . Malignant hypertension 02/27/2013  . Other and unspecified hyperlipidemia 02/27/2013  . Noncompliance with medication regimen 02/27/2013  . Unspecified asthma(493.90) 02/27/2013  . Essential hypertension, benign 02/27/2013    Lovvorn, Radene JourneyLaura Ann, SLP 11/10/2014, 10:31 AM  [facility] [address] [place], [place], [address] Phone: [phone]   Fax:  [phone]

## 2014-11-14 ENCOUNTER — Ambulatory Visit: Payer: Medicare Other | Admitting: Occupational Therapy

## 2014-11-14 ENCOUNTER — Ambulatory Visit: Payer: Medicare Other

## 2014-11-24 ENCOUNTER — Ambulatory Visit: Payer: Medicare Other | Admitting: Speech Pathology

## 2014-11-24 ENCOUNTER — Ambulatory Visit: Payer: Medicare Other | Attending: Internal Medicine | Admitting: Occupational Therapy

## 2014-11-24 ENCOUNTER — Encounter: Payer: Self-pay | Admitting: Occupational Therapy

## 2014-11-24 DIAGNOSIS — G8191 Hemiplegia, unspecified affecting right dominant side: Secondary | ICD-10-CM | POA: Diagnosis present

## 2014-11-24 DIAGNOSIS — I69351 Hemiplegia and hemiparesis following cerebral infarction affecting right dominant side: Secondary | ICD-10-CM | POA: Diagnosis not present

## 2014-11-24 DIAGNOSIS — R41841 Cognitive communication deficit: Secondary | ICD-10-CM

## 2014-11-24 NOTE — Patient Instructions (Addendum)
Coordination Activities  Perform the following activities for 20 minutes 1-2 times per day with right hand(s).   Rotate ball in fingertips (clockwise and counter-clockwise).  Toss ball between hands.  Toss ball in air and catch with the same hand.  Flip cards 1 at a time as fast as you can.  Deal cards with your thumb (Hold deck in hand and push card off top with thumb).  Pick up coins, buttons, marbles, dried beans/pasta of different sizes and place in container.  Pick up coins and place in container or coin bank.  Pick up coins and stack.  Pick up coins one at a time until you get 5-10 in your hand, then move coins from palm to fingertips to stack one at a time.    Theraputty exercises:  Using red color putty:  Do these 1-2 times per day. STOP if you get pain and let your therapist know.  1. Make a ball     Make a pancake     Make a cone Do this sequence 3 times  2. Make a fat hot dog. Squeeze as hard as you can. Do this 10 times 3. Pull taffy.  Do this 10 times 4. Make a snake:  2 pt pinch  3 pt pinch  Lateral pinch  Do these 5 times.

## 2014-11-24 NOTE — Therapy (Signed)
[facility] [address] [place], [place], [address] Phone: [phone]   Fax:  [phone]  Occupational Therapy Treatment  Patient Details  Name: Kurt Lewis MRN: [id number] Date of Birth: 1941-04-09 Referring Provider:  Nolene Ebbs, MD  Encounter Date: 11/24/2014      OT End of Session - 11/24/14 1615    Visit Number 3   Number of Visits 8   Date for OT Re-Evaluation 11/24/14   OT Start Time 2725   OT Stop Time 1613   OT Time Calculation (min) 43 min   Activity Tolerance Patient tolerated treatment well      Past Medical History  Diagnosis Date  . Hypertension   . Stroke     History reviewed. No pertinent past surgical history.  There were no vitals filed for this visit.  Visit Diagnosis:  Hemiplegia affecting right dominant side      Subjective Assessment - 11/24/14 1540    Subjective  Things are good (via interpreter)   Pertinent History see epic snapshot;  CVA in 2011 and 2014;  daughter states reason for referral today is that pt fell.   Currently in Pain? No/denies                      OT Treatments/Exercises (OP) - 11/24/14 0001    Exercises   Exercises Hand   Hand Exercises   Theraputty Flatten;Roll;Grip;Pinch;Locate Pegs  15 pegs in red putty. Upgraded HEP see pt instructions                OT Education - 11/24/14 1612    Education provided Yes   Education Details theraputty, coordination upgraded   Person(s) Educated Patient;Other (comment)  intepretor   Methods Explanation;Demonstration;Tactile cues;Verbal cues;Handout  interpretor has agreed to translate handout   Comprehension Verbalized understanding;Returned demonstration             OT Long Term Goals - 11/24/14 1613    OT LONG TERM GOAL #1   Title Pt and family will be mod I with HEP - 11/24/2014   Status Achieved   OT LONG TERM GOAL #2   Title Pt will increase strength by 5 pounds of  grip for R hand to assist with functional tasks - baseline 20 pounds   Status Not Met   OT LONG TERM GOAL #3   Title PT will decrease time on 9 hole peg as evidencd of improved coordination by at least 5 seconds to assist with functional tasks - baseline 45.41   Status Not Met   OT LONG TERM GOAL #4   Title Pt will be able to write at sentence length with 100% legibility   Status Not Met               Plan - 11/24/14 1613    Clinical Impression Statement Due to transportation issues pt has only attended 2 visits for treatment. Pt and family foresee that transportation will remain a difficulty therefore discussed discharging from OT and providng upgraded HEP to address strength and coordination in R hand. Pt and family in agreement.    Rehab Potential Fair   Clinical Impairments Affecting Rehab Potential Given cognitive deficits and length of time since last stroke, will trial 4 weeks of OT to try and improve strength and coordination of dominant hand.   OT Frequency 2x / week   OT Duration 4 weeks   OT Treatment/Interventions Self-care/ADL training;Fluidtherapy;DME and/or AE instruction;Neuromuscular education;Therapeutic exercise;Manual  Therapy;Therapeutic activities;Patient/family education   Plan d/c from OT due to transporatation issues.   Consulted and Agree with Plan of Care Patient;Family member/caregiver   Family Member Consulted interpretor          G-Codes - 11-29-2014 1733    Functional Limitation Carrying, moving and handling objects   Carrying, Moving and Handling Objects Current Status [phone]) At least 60 percent but less than 80 percent impaired, limited or restricted   Carrying, Moving and Handling Objects Goal Status ([id number]) At least 40 percent but less than 60 percent impaired, limited or restricted   Carrying, Moving and Handling Objects Discharge Status [phone]) At least 60 percent but less than 80 percent impaired, limited or restricted      Problem  List Patient Active Problem List   Diagnosis Date Noted  . 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 Headache(784.0) 03/01/2013  . Encounter for long-term (current) use of medications 03/01/2013  . Acute delirium 03/01/2013  . Hemorrhage, intracranial 02/27/2013  . Malignant hypertension 02/27/2013  . Other and unspecified hyperlipidemia 02/27/2013  . Noncompliance with medication regimen 02/27/2013  . Unspecified asthma(493.90) 02/27/2013  . Essential hypertension, benign 02/27/2013   OCCUPATIONAL THERAPY DISCHARGE SUMMARY  Visits from Start of Care: 3  Current functional level related to goals / functional outcomes: See above goals. Pt did not achieve goals due to transportation issues. Pt has only attended two treatment sessions in 4 weeks.    Remaining deficits: Decreased R hand strength Decreased R hand coordination   Education / Equipment: HEP Plan: Patient agrees to discharge.  Patient goals were partially met. Patient is being discharged due to meeting the stated rehab goals.  ?????     Quay Burow, OTR/L Nov 29, 2014, 5:34 PM  [facility] [address] [place], [place], [address] Phone: [phone]   Fax:  [phone]

## 2014-11-24 NOTE — Patient Instructions (Signed)
Place signs at door,light switch and stove for visual reminders for pt to turn them offf and lock door

## 2014-11-24 NOTE — Therapy (Signed)
[facility] [address] [place], [place], [address] Phone: [phone]   Fax:  [phone]  Speech Language Pathology Treatment  Patient Details  Name: Kurt BlamerUnknown Montey MRN: [id number] Date of Birth: 08/07/1940 Referring Provider:  Fleet ContrasAvbuere, Edwin, MD  Encounter Date: 11/24/2014      End of Session - 11/24/14 1456    Visit Number 3   Number of Visits 8   Date for SLP Re-Evaluation 11/24/14   SLP Start Time 1405   SLP Stop Time  1445   SLP Time Calculation (min) 40 min   Activity Tolerance Patient tolerated treatment well      Past Medical History  Diagnosis Date  . Hypertension   . Stroke     No past surgical history on file.  There were no vitals filed for this visit.  Visit Diagnosis: Cognitive communication deficit             ADULT SLP TREATMENT - 11/24/14 1440    General Information   Behavior/Cognition Alert;Cooperative;Pleasant mood   Pain Assessment   Pain Assessment No/denies pain   Cognitive-Linquistic Treatment   Treatment focused on Cognition   Skilled Treatment Family reported that they have made a set place on a table for pt's meds and a box for his keys and wallet. Daughter reports that this has helped and that pt is using this system. Family states that now they know if the keys are not in the box, they are in Bra[place]'s pockets. Family has not placed signs near light swtich or stove reminding pt to turn those off at this tiime. They agree to place reminders for stove, light, and to lock the door in [place] to help with pt. saftey. Facilitated family knowledge of cognitive enhancing activities they can do at home. I demonstrated money counting, making change tasks with pt as well as memory game. Pt required occassional  min A to participate in these activities and enjoyed them. Pt stated he would do some  cognitive activities at home.    Assessment / Recommendations / Plan   Plan Continue  with current plan of care   Progression Toward Goals   Progression toward goals Progressing toward goals          SLP Education - 11/24/14 1452    Education provided Yes   Education Details memory compensations, cognitive activities they can do at home   Person(s) Educated Patient;Spouse;Child(ren)   Methods Explanation;Demonstration   Comprehension Verbalized understanding;Returned demonstration;Need further instruction            SLP Long Term Goals - 11/24/14 1455    SLP LONG TERM GOAL #1   Title Family will implement  external aid for orientation and report daily use over 3 sessions   Time 2   Period Weeks   Status On-going   SLP LONG TERM GOAL #2   Title Family will verbalize 3 cognitive enhancing activities pt can do at home and report carryover of these 3-5x a week over 3 sessions   Baseline 11/24/14   Time 2   Status On-going   SLP LONG TERM GOAL #3   Title Family will implement external aids managment of pt's belongings, money , meds and report daily carryover over 3 sessions    Time 2   Period Weeks   Status On-going          Plan - 11/24/14 1452    Clinical Impression Statement Interpreter, spouse and daughter accompanied pt. Spouse, daughter and  pt report success with using a consistent labeled spot for pt's key and medication. We idenfied other environmental modificiations for memory compensation. Coniue skilled ST for carryover of memory compensations and cognitive enhancing activities they can do at home.    Speech Therapy Frequency 2x / week   Duration 4 weeks   Treatment/Interventions Compensatory strategies;Patient/family education;Internal/external aids;SLP instruction and feedback   Potential to Achieve Goals Fair   Potential Considerations Ability to learn/carryover information;Previous level of function;Severity of impairments;Cooperation/participation level   Consulted and Agree with Plan of Care Patient;Family member/caregiver   Family Member  Consulted daugher        Problem List Patient Active Problem List   Diagnosis Date Noted  . 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 Headache(784.0) 03/01/2013  . Encounter for long-term (current) use of medications 03/01/2013  . Acute delirium 03/01/2013  . Hemorrhage, intracranial 02/27/2013  . Malignant hypertension 02/27/2013  . Other and unspecified hyperlipidemia 02/27/2013  . Noncompliance with medication regimen 02/27/2013  . Unspecified asthma(493.90) 02/27/2013  . Essential hypertension, benign 02/27/2013    Clinton SawyerLovvorn, Nahara Dona AnnMS,CCC-SLP 11/24/2014, 2:57 PM  [facility] [address] [place], [place], [address] Phone: [phone]   Fax:  [phone]

## 2014-12-01 ENCOUNTER — Encounter: Payer: Medicare Other | Admitting: Occupational Therapy

## 2014-12-01 ENCOUNTER — Ambulatory Visit: Payer: Medicare Other | Admitting: Physical Therapy

## 2014-12-01 ENCOUNTER — Ambulatory Visit: Payer: Medicare Other

## 2014-12-06 ENCOUNTER — Encounter: Payer: Medicare Other | Admitting: Speech Pathology

## 2014-12-06 ENCOUNTER — Encounter: Payer: Medicare Other | Admitting: Occupational Therapy

## 2014-12-16 ENCOUNTER — Ambulatory Visit: Payer: Medicare Other | Attending: Internal Medicine

## 2014-12-20 ENCOUNTER — Ambulatory Visit (INDEPENDENT_AMBULATORY_CARE_PROVIDER_SITE_OTHER): Payer: Medicare Other | Admitting: Neurology

## 2014-12-20 ENCOUNTER — Encounter: Payer: Self-pay | Admitting: Neurology

## 2014-12-20 VITALS — Ht <= 58 in | Wt 171.0 lb

## 2014-12-20 DIAGNOSIS — F0391 Unspecified dementia with behavioral disturbance: Secondary | ICD-10-CM | POA: Diagnosis not present

## 2014-12-20 DIAGNOSIS — F03918 Unspecified dementia, unspecified severity, with other behavioral disturbance: Secondary | ICD-10-CM

## 2014-12-20 MED ORDER — DIVALPROEX SODIUM 500 MG PO DR TAB: 500.0000 mg | DELAYED_RELEASE_TABLET | Freq: Every day | ORAL | Status: DC

## 2014-12-20 MED ORDER — DONEPEZIL HCL 5 MG PO TABS: 5.0000 mg | ORAL_TABLET | Freq: Every day | ORAL | Status: DC

## 2014-12-20 NOTE — Patient Instructions (Signed)
I had a long d/w patient and daughter about his remote strokes, dementia, risk for recurrent stroke/TIAs, personally independently reviewed imaging studies and stroke evaluation results and answered questions.Continue aspirin 81 mg orally every day  for secondary stroke prevention and maintain strict control of hypertension with blood pressure goal below 130/90, diabetes with hemoglobin A1c goal below 6.5% and lipids with LDL cholesterol goal below 100 mg/dL. I also advised the patient not to drive due to cognitive impairment from dementia and start Aricept 5 mg daily for 4 weeks and increase if tolerated to 10 mg daily. Discontinue Zoloft as it is a low dose and not working instead take Depakote ER 500 mg once a day for behavior or agitation. Check EEG and dementia panel labs. Return for follow-up in 3 months or call earlier if necessary.   Vascular Dementia Vascular dementia is a common cause of dementia in the elderly. Dementia is a condition that affects the brain and causes people to not think well or act normally. Vascular dementia is one type of dementia. It occurs when blood clots block small blood vessels in the brain and destroy brain tissue. Likely risk factors are high blood pressure and advanced age. This disease can cause stroke, migraine-like headaches, and psychiatric disturbances.  SYMPTOMS   Confusion.  Problems with recent memory.  Wandering or getting lost in familiar places.  Loss of bladder or bowel control (incontinence).  Unsteady gait.  Poor attention and concentration.  Emotional problems such as laughing or crying inappropriately.  Difficulty following instructions.  Problems handling money.  Depression.  Difficulty planning ahead. Usually the damage is slight at first. Over time, as more small vessels are blocked, there is a gradual mental decline. However, symptoms may begin suddenly. Symptoms may be very similar to Alzheimer's disease. The two forms of dementia  may occur together. Vascular dementia typically begins between the ages of 7160 and 4275. It affects men more often than women. TREATMENT   Currently there is no treatment for vascular dementia that can reverse the damage that has already occurred.  Treatment focuses on prevention of additional brain damage and improvement of symptoms.  It is important to treat the risk factors for vascular dementia, such as keeping blood pressure under control, treating diabetes, lowering cholesterol, and stop smoking. PROGNOSIS   Prognosis for patients is generally poor. Individuals with the disease may improve for short periods of time, then get worse again. Early treatment and management of blood pressure and other risk factors may prevent further worsening of the disorder. Document Released: 06/21/2002 Document Revised: 09/23/2011 Document Reviewed: 05/05/2009 [facility] Patient Information 2015 [place]. This information is not intended to replace advice given to you by your health care provider. Make sure you discuss any questions you have with your health care provider.

## 2014-12-20 NOTE — Progress Notes (Signed)
[facility] [address] [place]. [place] [address] [phone]       OFFICE FOLLOW-UP NOTE  [name] Date of Birth:  March 21, 1941 Medical Record Number:  [id number]   HPI:  54 year [place] male seen today for the first office follow visit for hospital admission for stroke on 02/25/13. He speaks limited [place] and his daughter who was present throughout this visit translates for him. He presented with sudden onset of headache and confusion. This apparently developed 3 days after his granddaughter died unexpectedly in [place] and he got a telephone call for this. 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 His blood pressure was elevated. He was kept in the intensive care unit and blood pressure was aggressively treated. He did quite well and was able to swallow. He was seen with physical occupational therapy and remained stable on followup CT head. He had only mild diminished right-sided fine motor skills and right lower facial weakness which is likely old from a previous stroke from 2 years ago. Was discharged home to the care of his family has done well and has obtained substantial improvement. The daughter states that she's noticed some memory difficulties and cognitive decline since the hemorrhage. His gait and walking and fine motor skills seem to be improving. His blood pressure is also much better and it is usually in the 130s at home. He is also been driving to a limited degree. He does have a prior history of left brain subcortical infarct 2 years ago and residual right lower facial weakness from that. He did not been taking any aspirin since his stroke Update 12/19/2014 :  Patient returns for follow-up after last visit   a year and a half ago. He is accompanied by his daughter who provides  most of the history as patient does not speak [place]. The patient has had some worsening cognition as well as intermittent confusion and behavioral agitation for the last several months. Patient is also been quite sleepy and can sleep for several days when he gets up is disoriented and confused. His balance is also not right and is had a few minor falls but not injured himself seriously. The patient size primary care physician who reduced the dose of Zoloft from 5025 mg but this does not seem to have made any difference. The patient has at times been verbally abusive to his wife and gets agitated quite easily. His primary care physician ordered a CT scan of the head on 09/12/14 which are personally reviewed and shows generalized cortical atrophy and extensive white matter changes but no acute abnormality. He has also been complaining of chronic headaches since her stroke which have not recovered. The family has not noticed any new focal neurological deficits in the form of increased weakness, numbness or speech problems. There is no family history of Alzheimer's dementia. ROS:   14 system review of systems is positive for    Fatigue, blurred vision, shortness of breath, cough, incontinence, memory loss, confusion, headache, weakness, dizziness, sleepiness, snoring, too much sleep, decreased energy PMH:  Past Medical History  Diagnosis Date  . Hypertension   . Diabetes mellitus without complication   . COPD (chronic obstructive pulmonary disease)   . Environmental allergies   . Depression   . Hyperlipidemia   . Memory loss   .  Blurred vision   . SOB (shortness of breath)   . Incontinence of urine   . Dizziness   . Headache   . Confusion   . Fatigue   . Stroke 06/2010    x 2  . Asthma   . Falls frequently     since Dec 2015    Social History:  History   Social History  . Marital Status: Married    Spouse Name: N/A  . Number of Children: 6  . Years of Education: 5   Occupational  History  . retired [employer]    retired 2010   Social History Main Topics  . Smoking status: Former Smoker -- 0.00 packs/day    Types: Cigarettes  . Smokeless tobacco: Not on file     Comment: 12/20/14 quit years ago per daughter  . Alcohol Use: No  . Drug Use: No  . Sexual Activity: Not on file   Other Topics Concern  . Not on file   Social History Narrative   Lives with wife, 5 children   Right handed   Caffeine use - coffee 2 cups a day, soda 2 a day    Medications:   Current Outpatient Prescriptions on File Prior to Visit  Medication Sig Dispense Refill  . acetaminophen (TYLENOL) 500 MG tablet Take 1,000 mg by mouth daily as needed (for headache).    Marland Kitchen. albuterol (PROVENTIL HFA;VENTOLIN HFA) 108 (90 BASE) MCG/ACT inhaler Inhale 2 puffs into the lungs every 6 (six) hours as needed for wheezing or shortness of breath. 1 Inhaler 1  . amLODipine (NORVASC) 5 MG tablet Take 5 mg by mouth daily.    Marland Kitchen. atorvastatin (LIPITOR) 10 MG tablet Take 1 tablet (10 mg total) by mouth daily at 6 PM. 30 tablet 1  . benzonatate (TESSALON) 100 MG capsule Take 100 mg by mouth 3 (three) times daily as needed for cough.    . Multiple Vitamins-Minerals (ALIVE MENS ENERGY PO) Take 2 capsules by mouth daily.     No current facility-administered medications on file prior to visit.    Allergies:  No Known Allergies  Physical Exam General:  Frail elderly [place] male, seated, in no evident distress Head: head normocephalic and atraumatic.   Neck: supple with no carotid or supraclavicular bruits Cardiovascular: regular rate and rhythm, no murmurs Musculoskeletal: no deformity Skin:  no rash/petichiae Vascular:  Normal pulses all extremities There were no vitals filed for this visit.  Neurologic Exam Mental Status: limited due to language barrier and daughter translates. Awake and fully alert. Oriented to place and time. Recent and remote memory intact.  . Mood and affect appropriate.   Mini-Mental status exam testing limited because patient does not speak English and is only fifth grade educated. He scored at least 10 /30 but did not attempt all questions. He was able to copy intersecting pentagons. Cranial Nerves: Fundoscopic exam reveals sharp disc margins. Pupils equal, briskly reactive to light. Extraocular movements full without nystagmus. Visual fields full to confrontation. Hearing intact. Facial sensation intact. Mild right lower face weakness, tongue, palate moves normally and symmetrically.  Motor: Normal bulk and tone. Normal strength in all tested extremity muscles. Sensory.: intact to touch and pinprick and vibratory.  Coordination: Rapid alternating movements normal in all extremities. Finger-to-nose and heel-to-shin performed accurately bilaterally. Gait and Station: Arises from chair without difficulty. Stance is skightly broad based. Gait demonstrates normal stride length and balance . Not able to heel, toe and tandem walk without difficulty.  Reflexes: 1+ and symmetric. Toes downgoing.   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 Prior history of left brain subcortical infarct 2 years ago. New complaints of intermittent confusion, behavioral agitation and cognitive worsening possibly from vascular dementia   PLAN: I had a long d/w patient and daughter about his remote strokes, dementia, risk for recurrent stroke/TIAs, personally independently reviewed imaging studies and stroke evaluation results and answered questions.Continue aspirin 81 mg orally every day  for secondary stroke prevention and maintain strict control of hypertension with blood pressure goal below 130/90, diabetes with hemoglobin A1c goal below 6.5% and lipids with LDL cholesterol goal below 100 mg/dL. I  also advised the patient not to drive due to cognitive impairment from dementia and start Aricept 5 mg daily for 4 weeks and increase if tolerated to 10 mg daily. Discontinue Zoloft as it is a low dose and not working instead take Depakote ER 500 mg once a day for behavior or agitation. Check EEG and dementia panel labs. Return for follow-up in 3 months or call earlier if necessary.   Delia Heady, MD

## 2014-12-21 LAB — DEMENTIA PANEL
HOMOCYSTEINE: 18.3 umol/L — AB (ref 0.0–15.0)
RPR Ser Ql: NONREACTIVE
TSH: 1.77 u[IU]/mL (ref 0.450–4.500)
VITAMIN B 12: 646 pg/mL (ref 211–946)

## 2014-12-22 ENCOUNTER — Telehealth: Payer: Self-pay

## 2014-12-22 NOTE — Telephone Encounter (Signed)
Voicemail left to instruct patient to call office back for her lab results, she has Slightly elevated homocysteine of unclear significance. Normal thyroid hormone and syphilis test.

## 2014-12-23 NOTE — Telephone Encounter (Signed)
Spoke to daughter Hyiu. Gave results per previous note. Faxed note to PCP. Daughter verbalized understanding.

## 2014-12-28 ENCOUNTER — Other Ambulatory Visit: Payer: Medicare Other

## 2014-12-29 ENCOUNTER — Ambulatory Visit: Payer: Medicare Other | Admitting: Physical Therapy

## 2015-01-03 ENCOUNTER — Telehealth: Payer: Self-pay | Admitting: *Deleted

## 2015-01-03 NOTE — Telephone Encounter (Signed)
-----   Message from Micki Riley, MD sent at 12/22/2014  8:42 AM EDT ----- . Kindly let patient know Slightly elevated homocysteine of unclear significance. Normal thyroid hormone and syphilis test.

## 2015-01-03 NOTE — Telephone Encounter (Signed)
Left detailed vm for patient's daughter informing her of Dr Marlis Edelson results report. Left this caller's name, number for any questions.

## 2015-01-09 ENCOUNTER — Other Ambulatory Visit: Payer: Self-pay

## 2015-03-29 ENCOUNTER — Telehealth: Payer: Self-pay

## 2015-03-29 ENCOUNTER — Ambulatory Visit (INDEPENDENT_AMBULATORY_CARE_PROVIDER_SITE_OTHER): Payer: Medicare Other | Admitting: Neurology

## 2015-03-29 ENCOUNTER — Encounter: Payer: Self-pay | Admitting: Neurology

## 2015-03-29 VITALS — BP 132/72 | HR 80 | Ht 60.0 in | Wt 167.6 lb

## 2015-03-29 DIAGNOSIS — F03918 Unspecified dementia, unspecified severity, with other behavioral disturbance: Secondary | ICD-10-CM

## 2015-03-29 DIAGNOSIS — F0391 Unspecified dementia with behavioral disturbance: Secondary | ICD-10-CM | POA: Diagnosis not present

## 2015-03-29 MED ORDER — DONEPEZIL HCL 23 MG PO TABS: 23.0000 mg | ORAL_TABLET | Freq: Every day | ORAL | Status: DC

## 2015-03-29 MED ORDER — FOLIC ACID 1 MG PO TABS: 1.0000 mg | ORAL_TABLET | Freq: Every day | ORAL | Status: DC

## 2015-03-29 NOTE — Progress Notes (Signed)
[facility] [address] [place]. [place] [address] [phone]       OFFICE FOLLOW-UP NOTE  [name] Date of Birth:  26-Jun-1941 Medical Record Number:  [id number]   HPI:  16 year [place] male seen today for the first office follow visit for hospital admission for stroke on 02/25/13. He speaks limited [place] and his daughter who was present throughout this visit translates for him. He presented with sudden onset of headache and confusion. This apparently developed 3 days after his granddaughter died unexpectedly in [place] and he got a telephone call for this. 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 His blood pressure was elevated. He was kept in the intensive care unit and blood pressure was aggressively treated. He did quite well and was able to swallow. He was seen with physical occupational therapy and remained stable on followup CT head. He had only mild diminished right-sided fine motor skills and right lower facial weakness which is likely old from a previous stroke from 2 years ago. Was discharged home to the care of his family has done well and has obtained substantial improvement. The daughter states that she's noticed some memory difficulties and cognitive decline since the hemorrhage. His gait and walking and fine motor skills seem to be improving. His blood pressure is also much better and it is usually in the 130s at home. He is also been driving to a limited degree. He does have a prior history of left brain subcortical infarct 2 years ago and residual right lower facial weakness from that. He did not been taking any aspirin since his stroke Update 12/19/2014 :  Patient returns for follow-up after last visit   a year and a half ago. He is accompanied by his daughter who provides most of  the history as patient does not speak [place]. The patient has had some worsening cognition as well as intermittent confusion and behavioral agitation for the last several months. Patient is also been quite sleepy and can sleep for several days when he gets up is disoriented and confused. His balance is also not right and is had a few minor falls but not injured himself seriously. The patient size primary care physician who reduced the dose of Zoloft from 5025 mg but this does not seem to have made any difference. The patient has at times been verbally abusive to his wife and gets agitated quite easily. His primary care physician ordered a CT scan of the head on 09/12/14 which are personally reviewed and shows generalized cortical atrophy and extensive white matter changes but no acute abnormality. He has also been complaining of chronic headaches since her stroke which have not recovered. The family has not noticed any new focal neurological deficits in the form of increased weakness, numbness or speech problems. There is no family history of Alzheimer's dementia. Update 03/29/2015 : He returns for follow-up after last visit 3 months ago. His accompanied by his wife, daughter as well as [place] language interpreter. The patient apparently has shown some improvement in his memory and behavior as well as agitation and is tolerating Aricept 10 mg daily without significant GI or neurological side effects. He however has some intermittent agitation and behavioral issues mostly centered around not being able to drive and wanting to drive. He is tolerating  Depakote 500 mg daily without any side effects. He did undergo some lab work at last visit and vitamin B12, TSH and RPR were normal. Homocystine was slightly elevated at 18. EEG was ordered but refused by [employer] and hence not done. The patient is not having any delusions, hallucinations, gait or balance problems. He does complain of his leg feeling weak  after he has walked a lot. ROS:   14 system review of systems is positive for    memory loss, headache, agitation, behavioral problems, confusion, decreased concentration, anxiety, nervousness, hyperactivity and all other systems negative PMH:  Past Medical History  Diagnosis Date  . Hypertension   . Diabetes mellitus without complication   . COPD (chronic obstructive pulmonary disease)   . Environmental allergies   . Depression   . Hyperlipidemia   . Memory loss   . Blurred vision   . SOB (shortness of breath)   . Incontinence of urine   . Dizziness   . Headache   . Confusion   . Fatigue   . Stroke 06/2010    x 2  . Asthma   . Falls frequently     since Dec 2015    Social History:  Social History   Social History  . Marital Status: Married    Spouse Name: N/A  . Number of Children: 6  . Years of Education: 5   Occupational History  . retired [employer]    retired 2010   Social History Main Topics  . Smoking status: Former Smoker -- 0.00 packs/day    Types: Cigarettes  . Smokeless tobacco: Not on file     Comment: 12/20/14 quit years ago per daughter  . Alcohol Use: No  . Drug Use: No  . Sexual Activity: Not on file   Other Topics Concern  . Not on file   Social History Narrative   Lives with wife, 5 children   Right handed   Caffeine use - coffee 2 cups a day, soda 2 a day    Medications:   Current Outpatient Prescriptions on File Prior to Visit  Medication Sig Dispense Refill  . acetaminophen (TYLENOL) 500 MG tablet Take 1,000 mg by mouth daily as needed (for headache).    Marland Kitchen albuterol (PROVENTIL HFA;VENTOLIN HFA) 108 (90 BASE) MCG/ACT inhaler Inhale 2 puffs into the lungs every 6 (six) hours as needed for wheezing or shortness of breath. 1 Inhaler 1  . amLODipine (NORVASC) 5 MG tablet Take 5 mg by mouth daily.    Marland Kitchen atorvastatin (LIPITOR) 10 MG tablet Take 1 tablet (10 mg total) by mouth daily at 6 PM. 30 tablet 1  . benzonatate (TESSALON) 100 MG  capsule Take 100 mg by mouth 3 (three) times daily as needed for cough.    . divalproex (DEPAKOTE) 500 MG DR tablet Take 1 tablet (500 mg total) by mouth daily. 30 tablet 2  . losartan-hydrochlorothiazide (HYZAAR) 100-12.5 MG per tablet     . Multiple Vitamins-Minerals (ALIVE MENS ENERGY PO) Take 2 capsules by mouth daily.    . SYMBICORT 80-4.5 MCG/ACT inhaler      No current facility-administered medications on file prior to visit.    Allergies:  No Known Allergies  Physical Exam General:  Frail elderly [place] male, seated, in no evident distress Head: head normocephalic and atraumatic.   Neck: supple with no carotid or supraclavicular bruits Cardiovascular: regular rate and rhythm, no murmurs Musculoskeletal: no deformity Skin:  no rash/petichiae Vascular:  Normal pulses  all extremities Filed Vitals:   03/29/15 0840  BP: 132/72  Pulse: 80    Neurologic Exam Mental Status: limited due to language barrier and interpreter translates. Awake and fully alert. Oriented to place and time. Recent and remote memory intact.  . Mood and affect appropriate.  Mini-Mental status exam testing limited because patient does not speak English and has only fifth grade education hence deferred.  Cranial Nerves: Fundoscopic exam reveals sharp disc margins. Pupils equal, briskly reactive to light. Extraocular movements full without nystagmus. Visual fields full to confrontation. Hearing intact. Facial sensation intact. Mild right lower face weakness, tongue, palate moves normally and symmetrically.  Motor: Normal bulk and tone. Normal strength in all tested extremity muscles. Sensory.: intact to touch and pinprick and vibratory.  Coordination: Rapid alternating movements normal in all extremities. Finger-to-nose and heel-to-shin performed accurately bilaterally. Gait and Station: Arises from chair without difficulty. Stance is skightly broad based. Gait demonstrates normal stride length and balance .  Not able to heel, toe and tandem walk without difficulty.  Reflexes: 1+ and symmetric. Toes downgoing.   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 Prior history of left brain subcortical infarct 2 years ago. Persistent complaints of intermittent confusion, behavioral agitation and cognitive worsening possibly from vascular dementia with some improvement on Aricept   PLAN: I had a long discussion with the patient, wife and daughter with the help of [place] language interpreter and discuss his vascular dementia and behavioral agitation. He seems to have shown some response to Aricept and is tolerating it well without significant side effects hence I recommend increasing the dose to 23 mg daily if tolerated. I have discussed possible side effects with the patient and family and asked him to call me if needed. Continue Depakote for his behavioral agitation which seems mostly related to the stress of not being able to drive. We will also start Folic acid 1 mg daily for his elevated homocystine. I have counseled the patient not to drive as he is not fit to do so. He will return for follow-up in 3 months or call earlier if necessary.    Delia Heady, MD

## 2015-03-29 NOTE — Telephone Encounter (Signed)
Patient speaks [place] and has the box check for language interpreter. Nurse left message for return phone call to make sure a interpreter was schedule for 0930 for patient.

## 2015-03-29 NOTE — Patient Instructions (Signed)
I had a long discussion with the patient, wife and daughter with the help of [place] language interpreter and discuss his vascular dementia and behavioral agitation. He seems to have shown some response to Aricept and is tolerating it well without significant side effects hence I recommend increasing the dose to 23 mg daily if tolerated. I have discussed possible side effects with the patient and family and asked him to call me if needed. We will also start Folic acid 1 mg daily for his elevated homocystine. I have counseled the patient not to drive as he is not fit to do so. He will return for follow-up in 3 months or call earlier if necessary.

## 2015-03-30 ENCOUNTER — Other Ambulatory Visit: Payer: Self-pay

## 2015-03-30 DIAGNOSIS — F0391 Unspecified dementia with behavioral disturbance: Secondary | ICD-10-CM

## 2015-03-30 DIAGNOSIS — F03918 Unspecified dementia, unspecified severity, with other behavioral disturbance: Secondary | ICD-10-CM

## 2015-03-30 MED ORDER — DIVALPROEX SODIUM 500 MG PO DR TAB: 500.0000 mg | DELAYED_RELEASE_TABLET | Freq: Every day | ORAL | Status: DC

## 2015-06-01 ENCOUNTER — Encounter: Payer: Self-pay | Admitting: Speech Pathology

## 2015-06-01 NOTE — Therapy (Signed)
[facility] [address] [place], [place], [address] Phone: [phone]   Fax:  [phone]  Patient Details  Name: Kurt Lewis MRN: [id number] Date of Birth: [birth year] Referring Provider:  No ref. provider found  Encounter Date: 06/01/2015   SPEECH THERAPY DISCHARGE SUMMARY Visits from Start of Care: 3  Current functional level related to goals / functional outcomes: ST goals not met due to pt not returning to speech therapy after 3 visits   Remaining deficits: Cognition  - memory, problem solving, judgement, reasoning   Education / Equipment: Compensations for reduced memory Plan: Patient agrees to discharge.  Patient goals were not met. Patient is being discharged due to not returning since the last visit.  ?????        Lovvorn, Annye Rusk  MS, CCC-SLP  06/01/2015, 11:27 AM  [facility] [address] [place], [place], [address] Phone: [phone]   Fax:  [phone]

## 2015-06-21 ENCOUNTER — Encounter: Payer: Self-pay | Admitting: Neurology

## 2015-06-21 ENCOUNTER — Ambulatory Visit (INDEPENDENT_AMBULATORY_CARE_PROVIDER_SITE_OTHER): Payer: Medicare Other | Admitting: Neurology

## 2015-06-21 VITALS — BP 156/92 | HR 60 | Ht 60.0 in | Wt 161.0 lb

## 2015-06-21 DIAGNOSIS — R413 Other amnesia: Secondary | ICD-10-CM | POA: Diagnosis not present

## 2015-06-21 NOTE — Progress Notes (Signed)
[facility] [address] [place]. [place] [address] [phone]       OFFICE FOLLOW-UP NOTE  [name] Date of Birth:  26-Jun-1941 Medical Record Number:  [id number]   HPI:  16 year [place] male seen today for the first office follow visit for hospital admission for stroke on 02/25/13. He speaks limited [place] and his daughter who was present throughout this visit translates for him. He presented with sudden onset of headache and confusion. This apparently developed 3 days after his granddaughter died unexpectedly in [place] and he got a telephone call for this. 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 His blood pressure was elevated. He was kept in the intensive care unit and blood pressure was aggressively treated. He did quite well and was able to swallow. He was seen with physical occupational therapy and remained stable on followup CT head. He had only mild diminished right-sided fine motor skills and right lower facial weakness which is likely old from a previous stroke from 2 years ago. Was discharged home to the care of his family has done well and has obtained substantial improvement. The daughter states that she's noticed some memory difficulties and cognitive decline since the hemorrhage. His gait and walking and fine motor skills seem to be improving. His blood pressure is also much better and it is usually in the 130s at home. He is also been driving to a limited degree. He does have a prior history of left brain subcortical infarct 2 years ago and residual right lower facial weakness from that. He did not been taking any aspirin since his stroke Update 12/19/2014 :  Patient returns for follow-up after last visit   a year and a half ago. He is accompanied by his daughter who provides most of  the history as patient does not speak [place]. The patient has had some worsening cognition as well as intermittent confusion and behavioral agitation for the last several months. Patient is also been quite sleepy and can sleep for several days when he gets up is disoriented and confused. His balance is also not right and is had a few minor falls but not injured himself seriously. The patient size primary care physician who reduced the dose of Zoloft from 5025 mg but this does not seem to have made any difference. The patient has at times been verbally abusive to his wife and gets agitated quite easily. His primary care physician ordered a CT scan of the head on 09/12/14 which are personally reviewed and shows generalized cortical atrophy and extensive white matter changes but no acute abnormality. He has also been complaining of chronic headaches since her stroke which have not recovered. The family has not noticed any new focal neurological deficits in the form of increased weakness, numbness or speech problems. There is no family history of Alzheimer's dementia. Update 03/29/2015 : He returns for follow-up after last visit 3 months ago. His accompanied by his wife, daughter as well as [place] language interpreter. The patient apparently has shown some improvement in his memory and behavior as well as agitation and is tolerating Aricept 10 mg daily without significant GI or neurological side effects. He however has some intermittent agitation and behavioral issues mostly centered around not being able to drive and wanting to drive. He is tolerating  Depakote 500 mg daily without any side effects. He did undergo some lab work at last visit and vitamin B12, TSH and RPR were normal. Homocystine was slightly elevated at 18. EEG was ordered but refused by [employer] and hence not done. The patient is not having any delusions, hallucinations, gait or balance problems. He does complain of his leg feeling weak  after he has walked a lot. Update 06/21/2015 : Patient returns for follow-up after last visit 3 months ago. His accompanied by his wife, daughter as well as [place] language interpreter. The patient apparently did not like the increased dose of Aricept to 23 mg and after a couple of weeks decided to stop all his medications. He in fact has been doing slightly better as per his wife. He has more energy and is less tired. He is fairly independent and in fact even drives. But he continues to have behavioral agitation issues. His blood pressure is elevated today at 156/92. The patient himself denies any complaints though he says he gets some intermittent headaches which are mild.  ROS:   14 system review of systems is positive for    memory loss, headache, agitation,  fatigue, daytime sleepiness, decreased concentration, depression, nervousness, anxiety and all other systems negative PMH:  Past Medical History  Diagnosis Date  . Hypertension   . Diabetes mellitus without complication (HCC)   . COPD (chronic obstructive pulmonary disease) (HCC)   . Environmental allergies   . Depression   . Hyperlipidemia   . Memory loss   . Blurred vision   . SOB (shortness of breath)   . Incontinence of urine   . Dizziness   . Headache   . Confusion   . Fatigue   . Stroke (HCC) 06/2010    x 2  . Asthma   . Falls frequently     since Dec 2015    Social History:  Social History   Social History  . Marital Status: Married    Spouse Name: N/A  . Number of Children: 6  . Years of Education: 5   Occupational History  . retired [employer]    retired 2010   Social History Main Topics  . Smoking status: Former Smoker -- 0.00 packs/day    Types: Cigarettes  . Smokeless tobacco: Not on file     Comment: 12/20/14 quit years ago per daughter  . Alcohol Use: No  . Drug Use: No  . Sexual Activity: Not on file   Other Topics Concern  . Not on file   Social History Narrative   Lives with wife, 5  children   Right handed   Caffeine use - coffee 2 cups a day, soda 2 a day    Medications:   Current Outpatient Prescriptions on File Prior to Visit  Medication Sig Dispense Refill  . acetaminophen (TYLENOL) 500 MG tablet Take 1,000 mg by mouth daily as needed (for headache).    Marland Kitchen. albuterol (PROVENTIL HFA;VENTOLIN HFA) 108 (90 BASE) MCG/ACT inhaler Inhale 2 puffs into the lungs every 6 (six) hours as needed for wheezing or shortness of breath. 1 Inhaler 1  . amLODipine (NORVASC) 5 MG tablet Take 5 mg by mouth daily.    Marland Kitchen. atorvastatin (LIPITOR) 10 MG tablet Take 1 tablet (10 mg total) by mouth daily at 6 PM. 30 tablet 1  . benzonatate (TESSALON) 100 MG capsule Take 100 mg by mouth 3 (three) times daily as needed for cough.    . folic acid (  FOLVITE) 1 MG tablet Take 1 tablet (1 mg total) by mouth daily. 30 tablet 3  . losartan-hydrochlorothiazide (HYZAAR) 100-12.5 MG per tablet     . Multiple Vitamins-Minerals (ALIVE MENS ENERGY PO) Take 2 capsules by mouth daily.    . SYMBICORT 80-4.5 MCG/ACT inhaler     . divalproex (DEPAKOTE) 500 MG DR tablet Take 1 tablet (500 mg total) by mouth daily. (Patient not taking: Reported on 06/21/2015) 30 tablet 3  . donepezil (ARICEPT) 23 MG TABS tablet Take 1 tablet (23 mg total) by mouth at bedtime. (Patient not taking: Reported on 06/21/2015) 30 tablet 3   No current facility-administered medications on file prior to visit.    Allergies:  No Known Allergies  Physical Exam General:  Frail elderly [place] male, seated, in no evident distress Head: head normocephalic and atraumatic.   Neck: supple with no carotid or supraclavicular bruits Cardiovascular: regular rate and rhythm, no murmurs Musculoskeletal: no deformity Skin:  no rash/petichiae Vascular:  Normal pulses all extremities Filed Vitals:   06/21/15 1113  BP: 156/92  Pulse: 60    Neurologic Exam Mental Status: limited due to language barrier and interpreter translates. Awake and fully  alert. Oriented to place and time. Recent and remote memory intact.  . Mood and affect appropriate.  Mini-Mental status exam testing limited because patient does not speak English and has only fifth grade education hence deferred.  Cranial Nerves: Fundoscopic exam not done  . Pupils equal, briskly reactive to light. Extraocular movements full without nystagmus. Visual fields full to confrontation. Hearing intact. Facial sensation intact. Mild right lower face weakness, tongue, palate moves normally and symmetrically.  Motor: Normal bulk and tone. Normal strength in all tested extremity muscles. Sensory.: intact to touch and pinprick and vibratory.  Coordination: Rapid alternating movements normal in all extremities. Finger-to-nose and heel-to-shin performed accurately bilaterally. Gait and Station: Arises from chair without difficulty. Stance is slightly broad based. Gait demonstrates normal stride length and balance . Not able to heel, toe and tandem walk without difficulty.  Reflexes: 1+ and symmetric. Toes downgoing.   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 Prior history of left brain subcortical infarct 2 years ago. Persistent complaints of intermittent confusion, behavioral agitation and cognitive worsening possibly from vascular dementia .  PLAN: I had a long discussion with the patient, wife, daughter via using [place] language interpreter regarding his behavioral changes and dementia and I strongly counseled him to take his medications at least for blood pressure and cholesterol to prevent further strokes, brain hemorrhage and TIAs. The patient seemed to not have tolerated increasing dose of Aricept and hence I would stay off the Aricept for now. Family was instructed to call me if he had further  cognitive decline. Consider trial of Namenda later. Return for follow-up in 6 months or call earlier if necessary Delia Heady, MD

## 2015-06-21 NOTE — Patient Instructions (Signed)
I had a long discussion with the patient, wife, daughter via using [place] language interpreter regarding his behavioral changes and dementia and I strongly counseled him to take his medications at least for blood pressure and cholesterol to prevent further strokes, brain hemorrhage and TIAs. The patient seemed to not have tolerated increasing dose of Aricept and hence I would stay off the Aricept for now. Family was instructed to call me if he had further cognitive decline. Consider trial of Namenda later. Return for follow-up in 6 months or call earlier if necessary

## 2015-12-18 ENCOUNTER — Encounter: Payer: Self-pay | Admitting: Neurology

## 2015-12-18 ENCOUNTER — Ambulatory Visit (INDEPENDENT_AMBULATORY_CARE_PROVIDER_SITE_OTHER): Payer: Medicare Other | Admitting: Neurology

## 2015-12-18 VITALS — BP 175/89 | HR 66 | Wt 165.8 lb

## 2015-12-18 DIAGNOSIS — F0391 Unspecified dementia with behavioral disturbance: Secondary | ICD-10-CM | POA: Diagnosis not present

## 2015-12-18 DIAGNOSIS — F03918 Unspecified dementia, unspecified severity, with other behavioral disturbance: Secondary | ICD-10-CM

## 2015-12-18 MED ORDER — DIVALPROEX SODIUM 500 MG PO DR TAB: 500.0000 mg | DELAYED_RELEASE_TABLET | Freq: Two times a day (BID) | ORAL | Status: DC

## 2015-12-18 MED ORDER — AMLODIPINE BESYLATE 5 MG PO TABS: 10.0000 mg | ORAL_TABLET | Freq: Every day | ORAL | Status: DC

## 2015-12-18 MED ORDER — DONEPEZIL HCL 5 MG PO TABS: 5.0000 mg | ORAL_TABLET | Freq: Every day | ORAL | Status: DC

## 2015-12-18 NOTE — Progress Notes (Signed)
[facility] [address] [place]. [place] [address] [phone]       OFFICE FOLLOW-UP NOTE  [name] Date of Birth:  26-Jun-1941 Medical Record Number:  [id number]   HPI:  16 year [place] male seen today for the first office follow visit for hospital admission for stroke on 02/25/13. He speaks limited [place] and his daughter who was present throughout this visit translates for him. He presented with sudden onset of headache and confusion. This apparently developed 3 days after his granddaughter died unexpectedly in [place] and he got a telephone call for this. 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 His blood pressure was elevated. He was kept in the intensive care unit and blood pressure was aggressively treated. He did quite well and was able to swallow. He was seen with physical occupational therapy and remained stable on followup CT head. He had only mild diminished right-sided fine motor skills and right lower facial weakness which is likely old from a previous stroke from 2 years ago. Was discharged home to the care of his family has done well and has obtained substantial improvement. The daughter states that she's noticed some memory difficulties and cognitive decline since the hemorrhage. His gait and walking and fine motor skills seem to be improving. His blood pressure is also much better and it is usually in the 130s at home. He is also been driving to a limited degree. He does have a prior history of left brain subcortical infarct 2 years ago and residual right lower facial weakness from that. He did not been taking any aspirin since his stroke Update 12/19/2014 :  Patient returns for follow-up after last visit   a year and a half ago. He is accompanied by his daughter who provides most of  the history as patient does not speak [place]. The patient has had some worsening cognition as well as intermittent confusion and behavioral agitation for the last several months. Patient is also been quite sleepy and can sleep for several days when he gets up is disoriented and confused. His balance is also not right and is had a few minor falls but not injured himself seriously. The patient size primary care physician who reduced the dose of Zoloft from 5025 mg but this does not seem to have made any difference. The patient has at times been verbally abusive to his wife and gets agitated quite easily. His primary care physician ordered a CT scan of the head on 09/12/14 which are personally reviewed and shows generalized cortical atrophy and extensive white matter changes but no acute abnormality. He has also been complaining of chronic headaches since her stroke which have not recovered. The family has not noticed any new focal neurological deficits in the form of increased weakness, numbness or speech problems. There is no family history of Alzheimer's dementia. Update 03/29/2015 : He returns for follow-up after last visit 3 months ago. His accompanied by his wife, daughter as well as [place] language interpreter. The patient apparently has shown some improvement in his memory and behavior as well as agitation and is tolerating Aricept 10 mg daily without significant GI or neurological side effects. He however has some intermittent agitation and behavioral issues mostly centered around not being able to drive and wanting to drive. He is tolerating  Depakote 500 mg daily without any side effects. He did undergo some lab work at last visit and vitamin B12, TSH and RPR were normal. Homocystine was slightly elevated at 18. EEG was ordered but refused by [employer] and hence not done. The patient is not having any delusions, hallucinations, gait or balance problems. He does complain of his leg feeling weak  after he has walked a lot. Update 06/21/2015 : Patient returns for follow-up after last visit 3 months ago. His accompanied by his wife, daughter as well as [place] language interpreter. The patient apparently did not like the increased dose of Aricept to 23 mg and after a couple of weeks decided to stop all his medications. He in fact has been doing slightly better as per his wife. He has more energy and is less tired. He is fairly independent and in fact even drives. But he continues to have behavioral agitation issues. His blood pressure is elevated today at 156/92. The patient himself denies any complaints though he says he gets some intermittent headaches which are mild.  Update 12/18/2015 : He returns for follow-up after last visit 6 months ago. His accompanied by his daughter as well as [place] language interpreter. Patient continues to have mild memory loss as well as agitation, frustration and behavioral changes which are nonprogressive. He is tolerating Depakote ER 500 mg daily without side effects. He had trouble tolerating Aricept 23 mg the past and hence had stopped it. Patient has a new complaint of posterior headaches for the last 4-5 months. He describes his headache has been constant present throughout the day. Generalized. Pressure-like in sensation. He does take 1-2 tablets of Tylenol every day and states that it does help. She continues to have mild right hand weakness and diagnosed the right leg but is careful and has had 2 falls. He does admit to drinking a lot of [facility] and caffeinated soda every day. ROS:   14 system review of systems is positive for    memory loss, headache, agitation,  fatigue,  light sensitivity, shortness of breath, cold intolerance, dizziness, weakness, agitation, confusion, decreased concentration, walking difficulty, daytime sleepiness and nervousness and all other systems negative PMH:  Past Medical History  Diagnosis Date  . Hypertension   .  Diabetes mellitus without complication (HCC)   . COPD (chronic obstructive pulmonary disease) (HCC)   . Environmental allergies   . Depression   . Hyperlipidemia   . Memory loss   . Blurred vision   . SOB (shortness of breath)   . Incontinence of urine   . Dizziness   . Headache   . Confusion   . Fatigue   . Stroke (HCC) 06/2010    x 2  . Asthma   . Falls frequently     since Dec 2015    Social History:  Social History   Social History  . Marital Status: Married    Spouse Name: N/A  . Number of Children: 6  . Years of Education: 5   Occupational History  . retired [employer]    retired 2010   Social History Main Topics  . Smoking status: Former Smoker -- 0.00 packs/day    Types: Cigarettes  . Smokeless tobacco: Not on file     Comment: 12/20/14 quit years ago per daughter  . Alcohol Use: No  . Drug Use: No  . Sexual Activity: Not on file   Other Topics Concern  . Not on file   Social History  Narrative   Lives with wife, 5 children   Right handed   Caffeine use - coffee 2 cups a day, soda 2 a day    Medications:   Current Outpatient Prescriptions on File Prior to Visit  Medication Sig Dispense Refill  . acetaminophen (TYLENOL) 500 MG tablet Take 1,000 mg by mouth daily as needed (for headache).    Marland Kitchen albuterol (PROVENTIL HFA;VENTOLIN HFA) 108 (90 BASE) MCG/ACT inhaler Inhale 2 puffs into the lungs every 6 (six) hours as needed for wheezing or shortness of breath. 1 Inhaler 1  . atorvastatin (LIPITOR) 10 MG tablet Take 1 tablet (10 mg total) by mouth daily at 6 PM. 30 tablet 1  . benzonatate (TESSALON) 100 MG capsule Take 100 mg by mouth 3 (three) times daily as needed for cough.    . folic acid (FOLVITE) 1 MG tablet Take 1 tablet (1 mg total) by mouth daily. 30 tablet 3  . losartan-hydrochlorothiazide (HYZAAR) 100-12.5 MG per tablet     . Multiple Vitamins-Minerals (ALIVE MENS ENERGY PO) Take 2 capsules by mouth daily.    . SYMBICORT 80-4.5 MCG/ACT inhaler       No current facility-administered medications on file prior to visit.    Allergies:  No Known Allergies  Physical Exam General:  Frail elderly [place] male, seated, in no evident distress Head: head normocephalic and atraumatic.   Neck: supple with no carotid or supraclavicular bruits Cardiovascular: regular rate and rhythm, no murmurs Musculoskeletal: no deformity Skin:  no rash/petichiae Vascular:  Normal pulses all extremities Filed Vitals:   12/18/15 1702  BP: 175/89  Pulse: 66    Neurologic Exam Mental Status: limited due to language barrier and interpreter translates. Awake and fully alert. Oriented to place and time. Recent and remote memory intact.  . Mood and affect appropriate.  Mini-Mental status exam testing limited because patient does not speak English and has only fifth grade education hence deferred.  Cranial Nerves: Fundoscopic exam not done  . Pupils equal, briskly reactive to light. Extraocular movements full without nystagmus. Visual fields full to confrontation. Hearing intact. Facial sensation intact. Mild right lower face weakness, tongue, palate moves normally and symmetrically.  Motor: Normal bulk and tone. Normal strength in all tested extremity muscles. Sensory.: intact to touch and pinprick and vibratory.  Coordination: Rapid alternating movements normal in all extremities. Finger-to-nose and heel-to-shin performed accurately bilaterally. Gait and Station: Arises from chair without difficulty. Stance is slightly broad based. Gait demonstrates normal stride length and balance . Not able to heel, toe and tandem walk without difficulty.  Reflexes: 1+ and symmetric. Toes downgoing.      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 Prior history of left brain subcortical infarct 2 years ago.  Mild vascular dementia . New complaints of headaches likely tension headaches with analgesic rebound  PLAN: I had a long discussion with the patient, his daughter via using an [place] language interpreter about his mild vascular dementia which appears to be stable. He has new complaints of posterior headaches which sound like muscle tension headaches with a component of analgesic rebound. I recommend he stop over-the-counter Tylenol and instead increase Depakote dose to 500 mg twice daily. I also advised him to do neck stretching exercises. Start Aricept 5 mg daily in a low dose as he was unable to tolerate higher dose in the past.  Patient was given a 30 day refill of Norvasc upon family request. I also advised him to decrease his caffeine intake which may be contributing to his headaches. Greater than 50% time during this 25 minute visit was spent on counseling and coordination of care about his headaches, vascular dementia and behavioral agitation. He will return for follow-up in 3 months with Butch PennyMegan Millikan, nurse practitioner or call earlier if necessary. Delia HeadyPramod Odean Fester, MD

## 2015-12-18 NOTE — Patient Instructions (Signed)
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 He has new complaints of posterior headaches which sound like muscle tension headaches with a component of analgesic rebound. I recommend he stop over-the-counter Tylenol and instead increase Depakote dose to 500 mg twice daily. I also advised him to do neck stretching exercises. Start Aricept 5 mg daily in a low dose as he was unable to tolerate higher dose in the past. Patient was given a 30 day refill of Norvasc upon family request. I also advised him to decrease his caffeine intake which may be contributing to his headaches. He will return for follow-up in 3 months with Butch PennyMegan Millikan, nurse practitioner or call earlier if necessary. Tension Headache A tension headache is a feeling of pain, pressure, or aching that is often felt over the front and sides of the head. The pain can be dull, or it can feel tight (constricting). Tension headaches are not normally associated with nausea or vomiting, and they do not get worse with physical activity. Tension headaches can last from 30 minutes to several days. This is the most common type of headache. CAUSES The exact cause of this condition is not known. Tension headaches often begin after stress, anxiety, or depression. Other triggers may include:  Alcohol.  Too much caffeine, or caffeine withdrawal.  Respiratory infections, such as colds, flu, or sinus infections.  Dental problems or teeth clenching.  Fatigue.  Holding your head and neck in the same position for a long period of time, such as while using a computer.  Smoking. SYMPTOMS Symptoms of this condition include:  A feeling of pressure around the head.  Dull, aching head pain.  Pain felt over the front and sides of the head.  Tenderness in the muscles of the head, neck, and shoulders. DIAGNOSIS This condition may be diagnosed based on your  symptoms and a physical exam. Tests may be done, such as a CT scan or an MRI of your head. These tests may be done if your symptoms are severe or unusual. TREATMENT This condition may be treated with lifestyle changes and medicines to help relieve symptoms. HOME CARE INSTRUCTIONS Managing Pain  Take over-the-counter and prescription medicines only as told by your health care provider.  Lie down in a dark, quiet room when you have a headache.  If directed, apply ice to the head and neck area:  Put ice in a plastic bag.  Place a towel between your skin and the bag.  Leave the ice on for 20 minutes, 2-3 times per day.  Use a heating pad or a hot shower to apply heat to the head and neck area as told by your health care provider. Eating and Drinking  Eat meals on a regular schedule.  Limit alcohol use.  Decrease your caffeine intake, or stop using caffeine. General Instructions  Keep all follow-up visits as told by your health care provider. This is important.  Keep a headache journal to help find out what may trigger your headaches. For example, write down:  What you eat and drink.  How much sleep you get.  Any change to your diet or medicines.  Try massage or other relaxation techniques.  Limit stress.  Sit up straight, and avoid tensing your muscles.  Do not use tobacco products, including cigarettes, chewing tobacco, or e-cigarettes. If you need help quitting, ask your health care provider.  Exercise regularly as told  by your health care provider.  Get 7-9 hours of sleep, or the amount recommended by your health care provider. SEEK MEDICAL CARE IF:  Your symptoms are not helped by medicine.  You have a headache that is different from what you normally experience.  You have nausea or you vomit.  You have a fever. SEEK IMMEDIATE MEDICAL CARE IF:  Your headache becomes severe.  You have repeated vomiting.  You have a stiff neck.  You have a loss of  vision.  You have problems with speech.  You have pain in your eye or ear.  You have muscular weakness or loss of muscle control.  You lose your balance or you have trouble walking.  You feel faint or you pass out.  You have confusion.   This information is not intended to replace advice given to you by your health care provider. Make sure you discuss any questions you have with your health care provider.   Document Released: 07/01/2005 Document Revised: 03/22/2015 Document Reviewed: 10/24/2014 Elsevier Interactive Patient Education [employer]

## 2016-05-22 ENCOUNTER — Other Ambulatory Visit: Payer: Self-pay | Admitting: Neurology

## 2016-05-22 DIAGNOSIS — F0391 Unspecified dementia with behavioral disturbance: Secondary | ICD-10-CM

## 2016-05-22 DIAGNOSIS — F03918 Unspecified dementia, unspecified severity, with other behavioral disturbance: Secondary | ICD-10-CM

## 2016-05-27 ENCOUNTER — Other Ambulatory Visit: Payer: Self-pay

## 2016-05-27 DIAGNOSIS — F02818 Dementia in other diseases classified elsewhere, unspecified severity, with other behavioral disturbance: Secondary | ICD-10-CM

## 2016-05-27 DIAGNOSIS — G308 Other Alzheimer's disease: Principal | ICD-10-CM

## 2016-05-27 DIAGNOSIS — F0281 Dementia in other diseases classified elsewhere with behavioral disturbance: Secondary | ICD-10-CM

## 2016-05-27 MED ORDER — FOLIC ACID 1 MG PO TABS: 1.0000 mg | ORAL_TABLET | Freq: Every day | ORAL | 0 refills | Status: DC

## 2017-03-16 ENCOUNTER — Other Ambulatory Visit: Payer: Self-pay | Admitting: Neurology

## 2017-03-16 DIAGNOSIS — F03918 Unspecified dementia, unspecified severity, with other behavioral disturbance: Secondary | ICD-10-CM

## 2017-03-16 DIAGNOSIS — F0391 Unspecified dementia with behavioral disturbance: Secondary | ICD-10-CM

## 2017-05-07 ENCOUNTER — Telehealth: Payer: Self-pay | Admitting: Neurology

## 2017-05-07 NOTE — Telephone Encounter (Signed)
Pt needs to call his PCP or go to urgent care for the depakote refills. Pt last seen 12/2015,and has been off meds for two weeks. Pt never follow up per Dr. Pearlean BrownieSEthi note at our office.Pt is taking the depakote for headaches. PT has an appt in 05/2017.

## 2017-05-07 NOTE — Telephone Encounter (Signed)
Pt's [facility] She said pt has been out of divalproex (DEPAKOTE) 500 MG DR tablet for 2 weeks. Is he able to get it refilled 1 more time to hold until his appt date. Pls send to [facility]

## 2017-05-07 NOTE — Telephone Encounter (Signed)
I called pt's daughter back. Per Katrina/RN refill cannot be authorized, pt has not been seen in 16 mths. Pt will need to be seen for further refills. Pt can see PCP or go to Urgent Care in the meantime if necessary. She understood and was appreciative.

## 2017-05-22 ENCOUNTER — Encounter: Payer: Self-pay | Admitting: Adult Health

## 2017-05-22 ENCOUNTER — Ambulatory Visit: Payer: Medicare Other | Admitting: Adult Health

## 2017-05-22 VITALS — BP 157/83 | HR 72 | Wt 157.0 lb

## 2017-05-22 DIAGNOSIS — Z5181 Encounter for therapeutic drug level monitoring: Secondary | ICD-10-CM

## 2017-05-22 DIAGNOSIS — F0391 Unspecified dementia with behavioral disturbance: Secondary | ICD-10-CM | POA: Diagnosis not present

## 2017-05-22 DIAGNOSIS — Z8673 Personal history of transient ischemic attack (TIA), and cerebral infarction without residual deficits: Secondary | ICD-10-CM

## 2017-05-22 MED ORDER — FOLIC ACID 1 MG PO TABS: 1.0000 mg | ORAL_TABLET | Freq: Every day | ORAL | 11 refills | Status: AC

## 2017-05-22 MED ORDER — DIVALPROEX SODIUM 500 MG PO DR TAB
500.0000 mg | DELAYED_RELEASE_TABLET | Freq: Every day | ORAL | 11 refills | Status: AC
Start: 2017-05-22 — End: ?

## 2017-05-22 NOTE — Patient Instructions (Signed)
Your Plan:  Continue to monitor memory Consider Namenda If speech begins to worsen let us know If he develops any stroke like symptoms please go to emergency room   Thank you for coming [facility] at [facility]. I hope we have been able to provide you high quality care today.  You may receive a patient satisfaction survey over the next few weeks. We would appreciate your feedback and comments so that we may continue to improve ourselves and the health of our patients.  Memantine Tablets What is this medicine? MEMANTINE (MEM an teen) is used to treat dementia caused by Alzheimer's disease. This medicine may be used for other purposes; ask your health care provider or pharmacist if you have questions. COMMON BRAND NAME(S): Namenda What should I tell my health care provider before I take this medicine? They need to know if you have any of these conditions: -difficulty passing urine -kidney disease -liver disease -seizures -an unusual or allergic reaction to memantine, other medicines, foods, dyes, or preservatives -pregnant or trying to get pregnant -breast-feeding How should I use this medicine? Take this medicine by mouth with a glass of water. Follow the directions on the prescription label. You may take this medicine with or without food. Take your doses at regular intervals. Do not take your medicine more often than directed. Continue to take your medicine even if you feel better. Do not stop taking except on the advice of your doctor or health care professional. Talk to your pediatrician regarding the use of this medicine in children. Special care may be needed. Overdosage: If you think you have taken too much of this medicine contact a poison control center or emergency room at once. NOTE: This medicine is only for you. Do not share this medicine with others. What if I miss a dose? If you miss a dose, take it as soon as you can. If it is almost time for your next  dose, take only that dose. Do not take double or extra doses. If you do not take your medicine for several days, contact your health care provider. Your dose may need to be changed. What may interact with this medicine? -acetazolamide -amantadine -cimetidine -dextromethorphan -dofetilide -hydrochlorothiazide -ketamine -metformin -methazolamide -quinidine -ranitidine -sodium bicarbonate -triamterene This list may not describe all possible interactions. Give your health care provider a list of all the medicines, herbs, non-prescription drugs, or dietary supplements you use. Also tell them if you smoke, drink alcohol, or use illegal drugs. Some items may interact with your medicine. What should I watch for while using this medicine? Visit your doctor or health care professional for regular checks on your progress. Check with your doctor or health care professional if there is no improvement in your symptoms or if they get worse. You may get drowsy or dizzy. Do not drive, use machinery, or do anything that needs mental alertness until you know how this drug affects you. Do not stand or sit up quickly, especially if you are an older patient. This reduces the risk of dizzy or fainting spells. Alcohol can make you more drowsy and dizzy. Avoid alcoholic drinks. What side effects may I notice from receiving this medicine? Side effects that you should report to your doctor or health care professional as soon as possible: -allergic reactions like skin rash, itching or hives, swelling of the face, lips, or tongue -agitation or a feeling of restlessness -depressed mood -dizziness -hallucinations -redness, blistering, peeling or loosening of the skin, including  inside the mouth -seizures -vomiting Side effects that usually do not require medical attention (report to your doctor or health care professional if they continue or are bothersome): -constipation -diarrhea -headache -nausea -trouble  sleeping This list may not describe all possible side effects. Call your doctor for medical advice about side effects. You may report side effects to FDA at 1-800-FDA-1088. Where should I keep my medicine? Keep out of the reach of children. Store at room temperature between 15 degrees and 30 degrees C (59 degrees and 86 degrees F). Throw away any unused medicine after the expiration date. NOTE: This sheet is a summary. It may not cover all possible information. If you have questions about this medicine, talk to your doctor, pharmacist, or health care provider.  2018 Elsevier/Gold Standard (2013-04-19 14:10:42)

## 2017-05-22 NOTE — Progress Notes (Signed)
PATIENT: Kurt Lewis DOB: May 03, 1941  REASON FOR VISIT: follow up HISTORY FROM: patient  HISTORY OF PRESENT ILLNESS: HISTORY 89 year [place] male seen today for the first office follow visit for hospital admission for stroke on 02/25/13. He speaks limited [place] and his daughter who was present throughout this visit translates for him. He presented with sudden onset of headache and confusion. This apparently developed 3 days after his granddaughter died unexpectedly in [place] and he got a telephone call for this. 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 His blood pressure was elevated. He was kept in the intensive care unit and blood pressure was aggressively treated. He did quite well and was able to swallow. He was seen with physical occupational therapy and remained stable on followup CT head. He had only mild diminished right-sided fine motor skills and right lower facial weakness which is likely old from a previous stroke from 2 years ago. Was discharged home to the care of his family has done well and has obtained substantial improvement. The daughter states that she's noticed some memory difficulties and cognitive decline since the hemorrhage. His gait and walking and fine motor skills seem to be improving. His blood pressure is also much better and it is usually in the 130s at home. He is also been driving to a limited degree. He does have a prior history of left brain subcortical infarct 2 years ago and residual right lower facial weakness from that. He did not been taking any aspirin since his stroke Update 12/19/2014 :  Patient returns for follow-up after last visit   a year and a half ago. He is accompanied by his daughter who provides most of the history as patient does not speak [place]. The patient has had  some worsening cognition as well as intermittent confusion and behavioral agitation for the last several months. Patient is also been quite sleepy and can sleep for several days when he gets up is disoriented and confused. His balance is also not right and is had a few minor falls but not injured himself seriously. The patient size primary care physician who reduced the dose of Zoloft from 5025 mg but this does not seem to have made any difference. The patient has at times been verbally abusive to his wife and gets agitated quite easily. His primary care physician ordered a CT scan of the head on 09/12/14 which are personally reviewed and shows generalized cortical atrophy and extensive white matter changes but no acute abnormality. He has also been complaining of chronic headaches since her stroke which have not recovered. The family has not noticed any new focal neurological deficits in the form of increased weakness, numbness or speech problems. There is no family history of Alzheimer's dementia. Update 03/29/2015 : He returns for follow-up after last visit 3 months ago. His accompanied by his wife, daughter as well as [place] language interpreter. The patient apparently has shown some improvement in his memory and behavior as well as agitation and is tolerating Aricept 10 mg daily without significant GI or neurological side effects. He however has some intermittent agitation and behavioral issues mostly centered around not being able to drive and wanting to drive. He is tolerating Depakote 500 mg daily without any side effects. He did undergo some lab work at last  visit and vitamin B12, TSH and RPR were normal. Homocystine was slightly elevated at 18. EEG was ordered but refused by [employer] and hence not done. The patient is not having any delusions, hallucinations, gait or balance problems. He does complain of his leg feeling weak after he has walked a lot. Update 06/21/2015 : Patient returns for  follow-up after last visit 3 months ago. His accompanied by his wife, daughter as well as [place] language interpreter. The patient apparently did not like the increased dose of Aricept to 23 mg and after a couple of weeks decided to stop all his medications. He in fact has been doing slightly better as per his wife. He has more energy and is less tired. He is fairly independent and in fact even drives. But he continues to have behavioral agitation issues. His blood pressure is elevated today at 156/92. The patient himself denies any complaints though he says he gets some intermittent headaches which are mild.  Update 12/18/2015 : He returns for follow-up after last visit 6 months ago. His accompanied by his daughter as well as [place] language interpreter. Patient continues to have mild memory loss as well as agitation, frustration and behavioral changes which are nonprogressive. He is tolerating Depakote ER 500 mg daily without side effects. He had trouble tolerating Aricept 23 mg the past and hence had stopped it. Patient has a new complaint of posterior headaches for the last 4-5 months. He describes his headache has been constant present throughout the day. Generalized. Pressure-like in sensation. He does take 1-2 tablets of Tylenol every day and states that it does help. She continues to have mild right hand weakness and diagnosed the right leg but is careful and has had 2 falls. He does admit to drinking a lot of [facility] and caffeinated soda every day.  Today 05/22/17 Kurt Lewis is a 76 year old male with a history of stroke and mild vascular dementia.  He returns today for follow-up.  There is an interpreter present however his daughter is doing a lot of the interpreting.  She reports that over the last year his confusion has improved.  She reports that his anger outburst has also improved as well.  She feels that in regards to his memory it has remained stable.  He is able to complete all ADLs  independently.  She states that he does not do much cooking as he was leaving the stove on.  She reports that he is sleeping better at night.  Reports good appetite.  She reports that approximately 2 months ago he was having difficulty with speech but that has improved.  The patient was unable to tolerate Aricept.  He remains on Depakote 500 mg daily.  He returns today for an evaluation.  REVIEW OF SYSTEMS: Out of a complete 14 system review of symptoms, the patient complains only of the following symptoms, and all other reviewed systems are negative.  Abdominal pain, incontinence of bowels, daytime sleepiness, speech difficulty, weakness, memory loss  ALLERGIES: Allergies  Allergen Reactions  . Aricept [Donepezil Hcl] Other (See Comments)    "abdominal issues, a lot more sleepy all the time"    HOME MEDICATIONS: Outpatient Medications Prior to Visit  Medication Sig Dispense Refill  . acetaminophen (TYLENOL) 500 MG tablet Take 1,000 mg by mouth daily as needed (for headache).    Marland Kitchen albuterol (PROVENTIL HFA;VENTOLIN HFA) 108 (90 BASE) MCG/ACT inhaler Inhale 2 puffs into the lungs every 6 (six) hours as needed for wheezing or  shortness of breath. 1 Inhaler 1  . amLODipine (NORVASC) 10 MG tablet Take 10 mg daily by mouth.  0  . atorvastatin (LIPITOR) 10 MG tablet Take 1 tablet (10 mg total) by mouth daily at 6 PM. 30 tablet 1  . benzonatate (TESSALON) 100 MG capsule Take 100 mg by mouth 3 (three) times daily as needed for cough.    . divalproex (DEPAKOTE) 500 MG DR tablet Take 1 tablet (500 mg total) by mouth 2 (two) times daily. 60 tablet 3  . folic acid (FOLVITE) 1 MG tablet Take 1 tablet (1 mg total) by mouth daily. 30 tablet 0  . losartan-hydrochlorothiazide (HYZAAR) 100-25 MG tablet Take 1 tablet daily by mouth.  0  . SYMBICORT 80-4.5 MCG/ACT inhaler     . donepezil (ARICEPT) 5 MG tablet Take 1 tablet (5 mg total) by mouth at bedtime. (Patient not taking: Reported on 05/22/2017) 30 tablet 0    . Multiple Vitamins-Minerals (ALIVE MENS ENERGY PO) Take 2 capsules by mouth daily.    Marland Kitchen. amLODipine (NORVASC) 5 MG tablet Take 2 tablets (10 mg total) by mouth daily. 30 tablet 0  . losartan-hydrochlorothiazide (HYZAAR) 100-12.5 MG per tablet      No facility-administered medications prior to visit.     PAST MEDICAL HISTORY: Past Medical History:  Diagnosis Date  . Asthma   . Blurred vision   . Confusion   . COPD (chronic obstructive pulmonary disease) (HCC)   . Depression   . Diabetes mellitus without complication (HCC)   . Dizziness   . Environmental allergies   . Falls frequently    since Dec 2015  . Fatigue   . Headache   . Hyperlipidemia   . Hypertension   . Incontinence of urine   . Memory loss   . SOB (shortness of breath)   . Stroke (HCC) 06/2010   x 2    PAST SURGICAL HISTORY: No past surgical history on file.  FAMILY HISTORY: No family history on file.  SOCIAL HISTORY: Social History   Socioeconomic History  . Marital status: Married    Spouse name: Not on file  . Number of children: 6  . Years of education: 5  . Highest education level: Not on file  Social Needs  . Financial resource strain: Not on file  . Food insecurity - worry: Not on file  . Food insecurity - inability: Not on file  . Transportation needs - medical: Not on file  . Transportation needs - non-medical: Not on file  Occupational History  . Occupation: retired    Associate Professormployer: [employer]    Comment: retired 2010  Tobacco Use  . Smoking status: Former Smoker    Packs/day: 0.00    Types: Cigarettes  . Smokeless tobacco: Never Used  . Tobacco comment: 12/20/14 quit years ago per daughter  Substance and Sexual Activity  . Alcohol use: No  . Drug use: No  . Sexual activity: Not on file  Other Topics Concern  . Not on file  Social History Narrative   Lives with wife, 5 children   Right handed   Caffeine use - coffee 2 cups a day, soda 2 a day   5th grade education   Doesn't  speak English      PHYSICAL EXAM  Vitals:   05/22/17 1044  BP: (!) 157/83  Pulse: 72  Weight: 157 lb (71.2 kg)   Body mass index is 30.66 kg/m.  Generalized: Well developed, in no acute distress  Neurological examination  Mentation: Alert.. Follows all commands speech and language fluent Cranial nerve II-XII: Pupils were equal round reactive to light. Extraocular movements were full, visual field were full on confrontational test. Facial sensation and strength were normal. Uvula tongue midline. Head turning and shoulder shrug  were normal and symmetric. Motor: The motor testing reveals 5 over 5 strength of all 4 extremities. Good symmetric motor tone is noted throughout.  Sensory: Sensory testing is intact to soft touch on all 4 extremities. No evidence of extinction is noted.  Coordination: Cerebellar testing reveals good finger-nose-finger and heel-to-shin bilaterally.  Gait and station: Patient uses a cane when ambulating. DIAGNOSTIC DATA (LABS, IMAGING, TESTING) - I reviewed patient records, labs, notes, testing and imaging myself where available.  Lab Results  Component Value Date   WBC 11.5 (H) 09/12/2014   HGB 14.5 09/12/2014   HCT 43.0 09/12/2014   MCV 88.8 09/12/2014   PLT 300 09/12/2014      Component Value Date/Time   NA 134 (L) 09/12/2014 1724   K 4.2 09/12/2014 1724   CL 103 09/12/2014 1724   CO2 27 09/12/2014 1724   GLUCOSE 92 09/12/2014 1724   BUN 12 09/12/2014 1724   CREATININE 1.18 09/12/2014 1724   CALCIUM 8.8 09/12/2014 1724   PROT 7.7 09/12/2014 1724   ALBUMIN 3.5 09/12/2014 1724   AST 26 09/12/2014 1724   ALT 11 09/12/2014 1724   ALKPHOS 49 09/12/2014 1724   BILITOT 1.2 09/12/2014 1724   GFRNONAA 60 (L) 09/12/2014 1724   GFRAA 69 (L) 09/12/2014 1724    Lab Results  Component Value Date   HGBA1C (H) 07/03/2010    6.1 (NOTE)                                                                       According to the ADA Clinical Practice  Recommendations for 2011, when HbA1c is used as a screening test:   >=6.5%   Diagnostic of Diabetes Mellitus           (if abnormal result  is confirmed)  5.7-6.4%   Increased risk of developing Diabetes Mellitus  References:Diagnosis and Classification of Diabetes Mellitus,Diabetes Care,2011,34(Suppl 1):S62-S69 and Standards of Medical Care in         Diabetes - 2011,Diabetes Care,2011,34  (Suppl 1):S11-S61.   Lab Results  Component Value Date   VITAMINB12 646 12/20/2014   Lab Results  Component Value Date   TSH 1.770 12/20/2014      ASSESSMENT AND PLAN 76 y.o. year old male  has a past medical history of Asthma, Blurred vision, Confusion, COPD (chronic obstructive pulmonary disease) (HCC), Depression, Diabetes mellitus without complication (HCC), Dizziness, Environmental allergies, Falls frequently, Fatigue, Headache, Hyperlipidemia, Hypertension, Incontinence of urine, Memory loss, SOB (shortness of breath), and Stroke (HCC) (06/2010). here with :  1.  History of stroke 2.  Vascular dementia  Overall the patient has remained stable.  Daughter feels that his confusion and anger outburst has improved with Depakote.  He will continue on Depakote ER 500 mg daily.  I will check blood work today.  In regards to the patient's memory I recommended that we could consider  Namenda.  The daughter states that she will read over this  medication and will call if they decide to start this.  I have advised the patient and his family that if he develops any strokelike symptoms they should call 911.  He will follow-up in 1 year or sooner if needed.    Butch PennyMegan Vermelle Cammarata, MSN, NP-C 05/22/2017, 11:10 AM [facility] [address], [address] [place], [place] [address] [phone]

## 2017-05-22 NOTE — Progress Notes (Signed)
I agree with the above plan 

## 2017-05-23 LAB — COMPREHENSIVE METABOLIC PANEL
ALBUMIN: 4.2 g/dL (ref 3.5–4.8)
ALT: 14 IU/L (ref 0–44)
AST: 20 IU/L (ref 0–40)
Albumin/Globulin Ratio: 1.2 (ref 1.2–2.2)
Alkaline Phosphatase: 69 IU/L (ref 39–117)
BUN / CREAT RATIO: 16 (ref 10–24)
BUN: 19 mg/dL (ref 8–27)
Bilirubin Total: 0.4 mg/dL (ref 0.0–1.2)
CALCIUM: 9.6 mg/dL (ref 8.6–10.2)
CO2: 29 mmol/L (ref 20–29)
CREATININE: 1.21 mg/dL (ref 0.76–1.27)
Chloride: 99 mmol/L (ref 96–106)
GFR, EST AFRICAN AMERICAN: 67 mL/min/{1.73_m2} (ref >59)
GFR, EST NON AFRICAN AMERICAN: 58 mL/min/{1.73_m2} — AB (ref >59)
GLOBULIN, TOTAL: 3.6 g/dL (ref 1.5–4.5)
Glucose: 76 mg/dL (ref 65–99)
Potassium: 4.7 mmol/L (ref 3.5–5.2)
SODIUM: 140 mmol/L (ref 134–144)
Total Protein: 7.8 g/dL (ref 6.0–8.5)

## 2017-05-23 LAB — CBC WITH DIFFERENTIAL/PLATELET
BASOS: 0 %
Basophils Absolute: 0 10*3/uL (ref 0.0–0.2)
EOS (ABSOLUTE): 0.1 10*3/uL (ref 0.0–0.4)
EOS: 1 %
HEMATOCRIT: 41.5 % (ref 37.5–51.0)
Hemoglobin: 13.9 g/dL (ref 13.0–17.7)
IMMATURE GRANULOCYTES: 0 %
Immature Grans (Abs): 0 10*3/uL (ref 0.0–0.1)
LYMPHS ABS: 2.9 10*3/uL (ref 0.7–3.1)
Lymphs: 33 %
MCH: 30.2 pg (ref 26.6–33.0)
MCHC: 33.5 g/dL (ref 31.5–35.7)
MCV: 90 fL (ref 79–97)
MONOS ABS: 0.9 10*3/uL (ref 0.1–0.9)
Monocytes: 9 %
NEUTROS ABS: 5.2 10*3/uL (ref 1.4–7.0)
Neutrophils: 57 %
Platelets: 312 10*3/uL (ref 150–379)
RBC: 4.6 x10E6/uL (ref 4.14–5.80)
RDW: 14.3 % (ref 12.3–15.4)
WBC: 9.1 10*3/uL (ref 3.4–10.8)

## 2017-05-23 LAB — VALPROIC ACID LEVEL: Valproic Acid Lvl: <4 ug/mL — ABNORMAL LOW (ref 50–100)

## 2017-05-26 ENCOUNTER — Telehealth: Payer: Self-pay | Admitting: *Deleted

## 2017-05-26 NOTE — Telephone Encounter (Signed)
LVM for daughter, Hyiu on DPR informing her that Mr Kurt Lewis's blood work is unremarkable. Advised her his Depakote level is low. Requested she please be sure that the patient is taking the medication as prescribed. Left number and advised she call back  If any questions.

## 2017-06-17 ENCOUNTER — Telehealth: Payer: Self-pay

## 2017-06-17 NOTE — Telephone Encounter (Signed)
Patient's daughter called to let you know that they would like to go ahead and start on the namenda discussed at his last visit.

## 2017-06-18 MED ORDER — MEMANTINE HCL 5 MG PO TABS: ORAL_TABLET | ORAL | 5 refills | Status: DC

## 2017-06-18 NOTE — Telephone Encounter (Signed)
Called and LVM for daughter ([place]) relaying MM,NP message below. Gave [facility] phone number if they have further questions or concerns.

## 2017-06-18 NOTE — Addendum Note (Signed)
Addended by: Enedina FinnerMILLIKAN, Laurice Kimmons P on: 06/18/2017 09:53 AM   Modules accepted: Orders

## 2017-06-18 NOTE — Telephone Encounter (Signed)
I called in  Namenda 5 mg tablet.  He will take 1 tablet daily for 1 week and if tolerating increase to 1 tablet twice a day thereafter.  If he tolerates this dose they should call back in 1 month so the dose can be adjusted.  Please call patient with this information.

## 2017-09-18 ENCOUNTER — Telehealth: Payer: Self-pay | Admitting: Adult Health

## 2017-09-18 NOTE — Telephone Encounter (Signed)
Pts daughter called stating the pt is no longer taking memantine (NAMENDA) 5 MG tablet stating it caused hallucinating  and made him very paranoid. Daughter isnt sure when he stopped taking the medication stating "its been awhile"

## 2017-09-18 NOTE — Telephone Encounter (Signed)
LMVM for daughter Melene PlanHuia Ksor to call me back.

## 2017-09-22 NOTE — Addendum Note (Signed)
Addended by: Guy BeginYOUNG, [place] S on: 09/22/2017 02:41 PM   Modules accepted: Orders

## 2017-09-22 NOTE — Telephone Encounter (Signed)
Noted  

## 2017-09-22 NOTE — Telephone Encounter (Signed)
LMVM for daughter again to return call.

## 2017-09-22 NOTE — Telephone Encounter (Signed)
Spoke to daughter and she it letting us know that the namenda that pt was started on, after a several days to a week noted changes of paranoia, hallucinations so wife stopped medication and pt is back to his baseline.  Has refills for his depakote and folic up to a yr 05/2018.

## 2018-05-13 ENCOUNTER — Encounter: Payer: Self-pay | Admitting: Adult Health

## 2018-05-25 ENCOUNTER — Ambulatory Visit: Payer: Medicare Other | Admitting: Adult Health

## 2018-12-23 ENCOUNTER — Other Ambulatory Visit: Payer: Self-pay | Admitting: *Deleted

## 2018-12-23 DIAGNOSIS — Z20822 Contact with and (suspected) exposure to covid-19: Secondary | ICD-10-CM

## 2018-12-25 LAB — NOVEL CORONAVIRUS, NAA: SARS-CoV-2, NAA: NOT DETECTED

## 2019-01-07 ENCOUNTER — Other Ambulatory Visit: Payer: Self-pay | Admitting: Adult Health

## 2019-01-07 DIAGNOSIS — F0391 Unspecified dementia with behavioral disturbance: Secondary | ICD-10-CM

## 2019-01-21 ENCOUNTER — Emergency Department (HOSPITAL_COMMUNITY): Payer: Medicare Other

## 2019-01-21 ENCOUNTER — Encounter (HOSPITAL_COMMUNITY): Payer: Self-pay | Admitting: Emergency Medicine

## 2019-01-21 ENCOUNTER — Emergency Department (HOSPITAL_COMMUNITY)
Admission: EM | Admit: 2019-01-21 | Discharge: 2019-01-21 | Disposition: A | Discharge status: Home / Self Care | Payer: Medicare Other | Attending: Emergency Medicine | Admitting: Emergency Medicine

## 2019-01-21 DIAGNOSIS — W1830XA Fall on same level, unspecified, initial encounter: Secondary | ICD-10-CM | POA: Diagnosis not present

## 2019-01-21 DIAGNOSIS — Z79899 Other long term (current) drug therapy: Secondary | ICD-10-CM | POA: Insufficient documentation

## 2019-01-21 DIAGNOSIS — R569 Unspecified convulsions: Secondary | ICD-10-CM | POA: Insufficient documentation

## 2019-01-21 LAB — BASIC METABOLIC PANEL
Anion gap: 12 (ref 5–15)
BUN: 12 mg/dL (ref 8–23)
CO2: 23 mmol/L (ref 22–32)
Calcium: 9.2 mg/dL (ref 8.9–10.3)
Chloride: 104 mmol/L (ref 98–111)
Creatinine, Ser: 1.59 mg/dL — ABNORMAL HIGH (ref 0.61–1.24)
GFR calc Af Amer: 48 mL/min — ABNORMAL LOW (ref >60)
GFR calc non Af Amer: 42 mL/min — ABNORMAL LOW (ref >60)
Glucose, Bld: 120 mg/dL — ABNORMAL HIGH (ref 70–99)
Potassium: 3.6 mmol/L (ref 3.5–5.1)
Sodium: 139 mmol/L (ref 135–145)

## 2019-01-21 LAB — PROTIME-INR
INR: 1.1 (ref 0.8–1.2)
Prothrombin Time: 14.3 seconds (ref 11.4–15.2)

## 2019-01-21 LAB — HEPATIC FUNCTION PANEL
ALT: 14 U/L (ref 0–44)
AST: 21 U/L (ref 15–41)
Albumin: 3.8 g/dL (ref 3.5–5.0)
Alkaline Phosphatase: 40 U/L (ref 38–126)
Bilirubin, Direct: 0.2 mg/dL (ref 0.0–0.2)
Indirect Bilirubin: 0.8 mg/dL (ref 0.3–0.9)
Total Bilirubin: 1 mg/dL (ref 0.3–1.2)
Total Protein: 7.8 g/dL (ref 6.5–8.1)

## 2019-01-21 LAB — I-STAT CHEM 8, ED
BUN: 16 mg/dL (ref 8–23)
Calcium, Ion: 1.15 mmol/L (ref 1.15–1.40)
Chloride: 104 mmol/L (ref 98–111)
Creatinine, Ser: 1.2 mg/dL (ref 0.61–1.24)
Glucose, Bld: 86 mg/dL (ref 70–99)
HCT: 41 % (ref 39.0–52.0)
Hemoglobin: 13.9 g/dL (ref 13.0–17.0)
Potassium: 4.1 mmol/L (ref 3.5–5.1)
Sodium: 140 mmol/L (ref 135–145)
TCO2: 28 mmol/L (ref 22–32)

## 2019-01-21 LAB — URINALYSIS, ROUTINE W REFLEX MICROSCOPIC
Bilirubin Urine: NEGATIVE
Glucose, UA: NEGATIVE mg/dL
Hgb urine dipstick: NEGATIVE
Ketones, ur: NEGATIVE mg/dL
Leukocytes,Ua: NEGATIVE
Nitrite: NEGATIVE
Protein, ur: NEGATIVE mg/dL
Specific Gravity, Urine: 1.013 (ref 1.005–1.030)
pH: 6 (ref 5.0–8.0)

## 2019-01-21 LAB — CBC WITH DIFFERENTIAL/PLATELET
Abs Immature Granulocytes: 0.04 10*3/uL (ref 0.00–0.07)
Basophils Absolute: 0 10*3/uL (ref 0.0–0.1)
Basophils Relative: 0 %
Eosinophils Absolute: 0 10*3/uL (ref 0.0–0.5)
Eosinophils Relative: 0 %
HCT: 38.2 % — ABNORMAL LOW (ref 39.0–52.0)
Hemoglobin: 12.2 g/dL — ABNORMAL LOW (ref 13.0–17.0)
Immature Granulocytes: 1 %
Lymphocytes Relative: 16 %
Lymphs Abs: 1.4 10*3/uL (ref 0.7–4.0)
MCH: 30.9 pg (ref 26.0–34.0)
MCHC: 31.9 g/dL (ref 30.0–36.0)
MCV: 96.7 fL (ref 80.0–100.0)
Monocytes Absolute: 0.6 10*3/uL (ref 0.1–1.0)
Monocytes Relative: 7 %
Neutro Abs: 6.4 10*3/uL (ref 1.7–7.7)
Neutrophils Relative %: 76 %
Platelets: 214 10*3/uL (ref 150–400)
RBC: 3.95 MIL/uL — ABNORMAL LOW (ref 4.22–5.81)
RDW: 14.4 % (ref 11.5–15.5)
WBC: 8.4 10*3/uL (ref 4.0–10.5)
nRBC: 0 % (ref 0.0–0.2)

## 2019-01-21 LAB — CBG MONITORING, ED: Glucose-Capillary: 111 mg/dL — ABNORMAL HIGH (ref 70–99)

## 2019-01-21 LAB — ETHANOL: Alcohol, Ethyl (B): <10 mg/dL (ref <10)

## 2019-01-21 LAB — RAPID URINE DRUG SCREEN, HOSP PERFORMED
Amphetamines: NOT DETECTED
Barbiturates: NOT DETECTED
Benzodiazepines: NOT DETECTED
Cocaine: NOT DETECTED
Opiates: NOT DETECTED
Tetrahydrocannabinol: NOT DETECTED

## 2019-01-21 LAB — SALICYLATE LEVEL: Salicylate Lvl: <7 mg/dL (ref 2.8–30.0)

## 2019-01-21 LAB — MAGNESIUM: Magnesium: 1.7 mg/dL (ref 1.7–2.4)

## 2019-01-21 LAB — APTT: aPTT: 36 seconds (ref 24–36)

## 2019-01-21 MED ORDER — DIVALPROEX SODIUM 500 MG PO DR TAB: 500.0000 mg | DELAYED_RELEASE_TABLET | Freq: Two times a day (BID) | ORAL | 0 refills | Status: AC

## 2019-01-21 MED ORDER — LEVETIRACETAM IN NACL 1000 MG/100ML IV SOLN
1000.0000 mg | Freq: Once | INTRAVENOUS | Status: AC
  Administered 2019-01-21: 1000 mg via INTRAVENOUS
  Filled 2019-01-21: qty 100

## 2019-01-21 MED ORDER — DIVALPROEX SODIUM 250 MG PO DR TAB
1000.0000 mg | DELAYED_RELEASE_TABLET | Freq: Once | ORAL | Status: AC
  Administered 2019-01-21: 1000 mg via ORAL
  Filled 2019-01-21: qty 4

## 2019-01-21 NOTE — ED Notes (Signed)
This RN called pt's dtr and went over pt's discharge instructions/ and plan. Pt's dtr will pick him up. Pt unable to sign for discharge, but has been informed on discharge plan and prescriptions by interpreter.

## 2019-01-21 NOTE — ED Provider Notes (Signed)
Medical screening examination/treatment/procedure(s) were conducted as a shared visit with non-physician practitioner(s) and myself.  I personally evaluated the patient during the encounter.    78 year old male here with new onset seizures.  He is back to his neurological baseline.  Have loaded with Keppra and will give urology follow-up   Lacretia Leigh, MD 01/21/19 1542

## 2019-01-21 NOTE — ED Triage Notes (Signed)
Pt here from a store where he had a witnessed seizure lasting approx 30 sec according to by standers pt awake and alert on arrival , does not speak english

## 2019-01-21 NOTE — Discharge Instructions (Addendum)
You have been diagnosed today with Seizure.  At this time there does not appear to be the presence of an emergent medical condition, however there is always the potential for conditions to change. Please read and follow the below instructions.  Please return to the Emergency Department immediately for any new or worsening symptoms or if you have another seizure. Please be sure to follow up with your Primary Care Provider within one week regarding your visit today; please call their office to schedule an appointment even if you are feeling better for a follow-up visit. Please change patient's medication of Depakote.  He will need to take this twice daily instead of only 1 times daily.  His new prescription is Depakote 500 mg twice daily for treatment of seizures.  [facility] to schedule a follow-up for his new seizures.  Additionally please be sure that he does not drive or operate heavy machinery, swim or climb ladders or perform any other dangerous activity that may be harmful if he has another seizure.  Get help right away if: You have a seizure that: Lasts longer than 5 minutes. Is different than previous seizures. Leaves you unable to speak or use a part of your body. Makes it harder to breathe. You have: A seizure after a head injury. Multiple seizures in a row. Confusion or a severe headache right after a seizure. You do not wake up immediately after a seizure. You injure yourself during a seizure. Any new/concerning or worsening symptoms  Please read the additional information packets attached to your discharge summary.  Do not take your medicine if  develop an itchy rash, swelling in your mouth or lips, or difficulty breathing; call 911 and seek immediate emergency medical attention if this occurs. ================ Below has been translated using Google translate.  Errors may be present.  Please interpret with caution.  D??i  ?y ? ???c d?ch b?ng Google d?ch. L?i c th? c m?t. Hy th?n tr?ng gi?i thch. ============ B?n ? ???c ch?n ?on ngy hm nay v?i Seizure.  T?i th?i ?i?m ny d??ng nh? khng c s? hi?n di?n c?a m?t tnh tr?ng y t? m?i n?i, tuy nhin lun c kh? n?ng ?i?u ki?n thay ??i. Xin vui lng ??c v lm theo cc h??ng d?n d??i ?y.  1. Vui lng quay l?i [place]?p c?u ngay l?p t?c n?u c b?t k? tri?u ch?ng m?i ho?c x?u ?i ho?c n?u b?n b? co gi?t khc. 2. Hy ch?c ch?n theo di v?i Nh cung c?p d?ch v? ch?m [place] chnh c?a b?n trong vng m?t tu?n v? chuy?n th?m c?a b?n ngy hm nay; vui lng g?i cho v?n phng c?a h? ?? s?p x?p m?t cu?c h?n ngay c? khi b?n c?m th?y t?t h?n cho l?n ti khm. 3. Hy thay ??i thu?c Depakote c?a b?nh nhn. Anh ta s? c?n ph?i th?c hi?n hai l?n m?i ngy thay v ch? 1 l?n m?i ngy. ??n thu?c m?i c?a anh l Depakote 500 mg hai l?n m?i ngy ?? ?i?u tr? co gi?t. Vui lng g?i cho bc s? th?n kinh t?i khoa th?n kinh [place] vo sng mai ?? ln l?ch theo di cc c?n ??ng kinh m?i c?a anh ?y. Ngoi ra, hy ch?c ch?n r?ng anh ta khng li xe ho?c v?n hnh my mc h?ng n?ng, b?i ho?c leo ln thang ho?c th?c hi?n b?t k? ho?t ??ng nguy hi?m no khc c th? gy h?i n?u anh ta b? ??ng kinh  khc.  Nh?n tr? gip ngay n?u: ? B?n c m?t c?n ??ng kinh r?ng: o Ko di h?n 5 pht. o Khc v?i cc c?n ??ng kinh tr??c ?. o L b?n khng th? ni ho?c s? d?ng m?t ph?n c?a c? th? c?a b?n. o Lm cho kh th? h?n. ? B?n c: o M?t c?n ??ng kinh sau m?t ch?n th??ng ??u. o Nhi?u c?n co gi?t lin ti?p. o Nh?m l?n ho?c ?au ??u d? d?i ngay sau khi ln c?n. ? B?n khng th?c d?y ngay sau c?n ??ng kinh. ? B?n b? th??ng trong m?t c?n ??ng kinh. ? B?t k? tri?u ch?ng m?i / lin quan ho?c x?u ?i  Xin vui lng ??c cc gi thng tin b? sung km [facility] tm t?t x? th?i c?a b?n.  Khng dng thu?c n?u b? pht ban ng?a, s?ng ? mi?ng ho?c mi ho?c kh th?; g?i 911 v tm ki?m s? ch?m [place] y t? kh?n c?p ngay l?p t?c n?u ?i?u ny  x?y ra.

## 2019-01-21 NOTE — ED Provider Notes (Addendum)
[facility] Note   CSN: [id number] Arrival date & time: 01/21/19  1248    History   Chief Complaint Chief Complaint  Patient presents with   Seizures    HPI Kurt Lewis is a 78 y.o. male   History obtained on initial evaluation from nursing staff. Patient arrives today via EMS he was ambulating with his cane to a gas station when he fell and had seizure-like activity for approximately 30 seconds, no injuries reported, no medications prior to arrival.  Patient speaks [place], no family or translator available at this time.  On my initial evaluation patient is alert, interactive and in no acute distress.  Movement of all joints without pain, no signs of injury of the head neck or back. - 2:25 PM: Translator is now at bedside.  Patient reports that he was in his normal state of health earlier today ambulating to the gas station when he suddenly woke up on the ground with people lifting him up off the ground.  He denies any dizziness, lightheadedness, chest pain, shortness of breath or vision changes prior to loss of consciousness.  Patient reports that after he woke up on the ground he felt slightly confused but had no pain and now reports that he is feeling well and has no complaints at all.  Patient reports that this is never happened to him before and he has no history of seizures.  Patient reports that he is otherwise healthy but reports that his son is concerned that he may have had a stroke many months ago but he has not sought medical evaluation for this.  Additionally patient reports that he takes 5 medications given to him by his son daily he does not know what these medications are.  He denies any headache, vision changes, chest pain/shortness of breath, fever/chills, cough, abdominal pain, nausea/vomiting, vision changes, back pain, neck pain or pain to his extremities x4.  Of note translator at bedside reports patient speech is clear for  language.    HPI  History reviewed. No pertinent past medical history.  There are no active problems to display for this patient.   History reviewed. No pertinent surgical history.      Home Medications    Prior to Admission medications   Medication Sig Start Date End Date Taking? Authorizing Provider  amLODipine (NORVASC) 10 MG tablet Take 10 mg by mouth at bedtime.  01/08/19  Yes [provider]  atorvastatin (LIPITOR) 10 MG tablet Take 10 mg by mouth at bedtime. 01/08/19  Yes [provider]  folic acid (FOLVITE) 1 MG tablet Take 1 mg by mouth daily. 10/04/18  Yes [provider]  losartan-hydrochlorothiazide (HYZAAR) 100-25 MG tablet Take 1 tablet by mouth at bedtime.  01/07/19  Yes [provider]  divalproex (DEPAKOTE) 500 MG DR tablet Take 1 tablet (500 mg total) by mouth 2 (two) times daily. 01/21/19   Bill SalinasMorelli, Barrett Goldie A, PA-C    Family History History reviewed. No pertinent family history.  Social History Social History   Tobacco Use   Smoking status: Never Smoker   Smokeless tobacco: Never Used  Substance Use Topics   Alcohol use: Not Currently   Drug use: Never     Allergies   Aricept [donepezil hcl] and Memantine  Review of Systems Review of Systems  Constitutional: Negative.  Negative for chills and fever.  Eyes: Negative.  Negative for visual disturbance.  Respiratory: Negative.  Negative for cough and shortness of  breath.   Cardiovascular: Negative.  Negative for chest pain.  Gastrointestinal: Negative.  Negative for abdominal pain, nausea and vomiting.  Musculoskeletal: Negative.  Negative for back pain and neck pain.  Neurological: Positive for seizures and syncope. Negative for speech difficulty, weakness, numbness and headaches.  All other systems reviewed and are negative.  Physical Exam Updated Vital Signs BP 136/69    Pulse 73    Temp (!) 97 F (36.1 C) (Rectal)    Resp 18    SpO2 97%   Physical  Exam Constitutional:      General: He is not in acute distress.    Appearance: Normal appearance. He is well-developed. He is not ill-appearing or diaphoretic.  HENT:     Head: Normocephalic and atraumatic.     Right Ear: External ear normal.     Left Ear: External ear normal.     Nose: Nose normal.  Eyes:     General: Vision grossly intact. Gaze aligned appropriately.     Pupils: Pupils are equal, round, and reactive to light.  Neck:     Musculoskeletal: Normal range of motion.     Trachea: Trachea and phonation normal. No tracheal deviation.  Cardiovascular:     Rate and Rhythm: Normal rate and regular rhythm.  Pulmonary:     Effort: Pulmonary effort is normal. No respiratory distress.  Abdominal:     General: There is no distension.     Palpations: Abdomen is soft.     Tenderness: There is no abdominal tenderness. There is no guarding or rebound.  Musculoskeletal: Normal range of motion.        General: No tenderness or deformity.     Comments: No midline C/T/L spinal tenderness to palpation, no paraspinal muscle tenderness, no deformity, crepitus, or step-off noted. No sign of injury to the neck or back. - Moving all extremities spontaneously without signs of pain.  Is able to pull himself to a sitting position without assistance. - Hips stable to compression bilaterally without pain, patient is able bring bilateral knees to chest without assistance or difficulty.  Skin:    General: Skin is warm and dry.  Neurological:     Mental Status: He is alert.     GCS: GCS eye subscore is 4. GCS verbal subscore is 5. GCS motor subscore is 6.     Comments: Alert oriented at baseline per family, holds conversation through interpreter Able to follow 2 step commands without difficulty. Cranial Nerves: II: Peripheral visual fields grossly normal, pupils equal, round, reactive to light III,IV, VI: ptosis not present, extra-ocular motions intact bilaterally V,VII: Right-sided smile  droop, eyebrows raise symmetric, facial light touch sensation equal VIII: hearing grossly normal to voice X: uvula elevates symmetrically XI: bilateral shoulder shrug symmetric and strong XII: midline tongue extension without fassiculations Motor: Normal tone. 3/5 strength in upper and lower right extremities compared to left this is old per family Normally unsteady gait per family CV: distal pulses palpable throughout  Psychiatric:        Behavior: Behavior normal.      ED Treatments / Results  Labs (all labs ordered are listed, but only abnormal results are displayed) Labs Reviewed  BASIC METABOLIC PANEL - Abnormal; Notable for the following components:      Result Value   Glucose, Bld 120 (*)    Creatinine, Ser 1.59 (*)    GFR calc non Af Amer 42 (*)    GFR calc Af Amer 48 (*)  All other components within normal limits  CBC WITH DIFFERENTIAL/PLATELET - Abnormal; Notable for the following components:   RBC 3.95 (*)    Hemoglobin 12.2 (*)    HCT 38.2 (*)    All other components within normal limits  CBG MONITORING, ED - Abnormal; Notable for the following components:   Glucose-Capillary 111 (*)    All other components within normal limits  MAGNESIUM  SALICYLATE LEVEL  ETHANOL  RAPID URINE DRUG SCREEN, HOSP PERFORMED  URINALYSIS, ROUTINE W REFLEX MICROSCOPIC  PROTIME-INR  APTT  HEPATIC FUNCTION PANEL  I-STAT CHEM 8, ED    EKG None  Radiology Dg Chest Portable 1 View  Result Date: 01/21/2019 CLINICAL DATA:  Seizure and fall. EXAM: PORTABLE CHEST 1 VIEW COMPARISON:  None. FINDINGS: The cardiomediastinal silhouette is unremarkable. There is no evidence of focal airspace disease, pulmonary edema, suspicious pulmonary nodule/mass, pleural effusion, or pneumothorax. No acute bony abnormalities are identified. IMPRESSION: No active disease. Electronically Signed   By: Margarette Canada M.D.   On: 01/21/2019 14:06   Ct Head Code Stroke Wo Contrast  Result Date:  01/21/2019 CLINICAL DATA:  78 year old male status post witnessed seizure. EXAM: CT HEAD WITHOUT CONTRAST TECHNIQUE: Contiguous axial images were obtained from the base of the skull through the vertex without intravenous contrast. COMPARISON:  Brain MRI 07/02/2010. Head CT 09/12/2014. FINDINGS: Brain: Stable cerebral volume. Stable ventricle size and configuration. No midline shift, mass effect, or evidence of intracranial mass lesion. No acute intracranial hemorrhage identified. Confluent and scattered bilateral cerebral white matter hypodensity with associated heterogeneity in the deep gray matter nuclei appear stable since 2016. Stable gray-white matter differentiation throughout the brain. No cortically based acute infarct identified. Vascular: Calcified atherosclerosis at the skull base. Dominant left vertebral artery. No suspicious intracranial vascular hyperdensity. Skull: No acute osseous abnormality identified. Sinuses/Orbits: Visualized paranasal sinuses and mastoids are stable and well pneumatized. Other: Visualized orbits and scalp soft tissues are within normal limits. ASPECTS [facility] Stroke Program Early CT Score) Total score (0-10 with 10 being normal): 10. IMPRESSION: 1. No acute intracranial abnormality identified. 2. Stable non contrast CT appearance of advanced chronic small vessel disease since 2016. 3. ASPECTS 10. Electronically Signed   By: Genevie Ann M.D.   On: 01/21/2019 15:01    Procedures Procedures (including critical care time)  Medications Ordered in ED Medications  divalproex (DEPAKOTE) DR tablet 1,000 mg (has no administration in time range)  levETIRAcetam (KEPPRA) IVPB 1000 mg/100 mL premix (0 mg Intravenous Stopped 01/21/19 1625)     Initial Impression / Assessment and Plan / ED Course  I have reviewed the triage vital signs and the nursing notes.  Pertinent labs & imaging results that were available during my care of the patient were reviewed by me and considered in my  medical decision making (see chart for details).  Clinical Course as of Jan 21 1627  Thu Jan 21, 2019  1554 1g depakote now; 500 bid, neuro follow-up. Keppra was ok   [BM]    Clinical Course User Index [BM] Deliah Boston, PA-C   Patient with some right-sided facial droop and right upper extremity weakness on exam, he is unsure whether he has had a stroke in the past.  No medical records available in chart, no family available at this time to speak to, code stroke initiated as patient's fall was approximately 2 hours ago with right-sided neuro deficits. 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  She reports that the patient is normally somewhat confused and often needs to be redirected by family members and will occasionally wander out of his home to go to the store.  She reports that patient has had right-sided facial droop and right extremity weakness since 2014 and that these findings are not new, she reports that patient is normally ambulatory with an unsteady gait and his cane, normally alert to himself, location and direct family members only.  She denies any recent illness or history of seizures. - APTT within normal limits PT/INR within normal limits Urinalysis within normal limits UDS negative Ethanol negative Salicylate negative LFTs within normal limits Magnesium within normal limits BMP with mildly elevated creatinine likely secondary to fall and seizure today CBC nonacute CBG 111  CT Head:  IMPRESSION:  1. No acute intracranial abnormality identified.  2. Stable non contrast CT appearance of advanced chronic small  vessel disease since 2016.  3. ASPECTS 10.   Chest x-ray:  IMPRESSION:  No active disease.  - After conversation with patient's family member these appeared to be old neuro deficits from stroke in 2014, code stroke was then canceled.  Patient was started on Keppra  for his new onset seizure, shortly after case was discussed with on-call neurology Dr. Amada JupiterKirkpatrick, he advises that we switch patient from Keppra to Depakote as he already takes this sub-therapeutically, he advises that after completion of Keppra load that we give patient 1 g of Depakote p.o. and then change patient to Depakote 500 mg twice daily and Outpatient neurology follow-up. - Patient seen and evaluated by Dr. Freida BusmanAllen who agrees with above neurology plan, Depakote load now, change to Depakote 500 mg twice daily and outpatient neuro follow-up, discharge.  At this time there does not appear to be any evidence of an acute emergency medical condition and the patient appears stable for discharge with appropriate outpatient follow up. Diagnosis was discussed with patient and daughter who verbalizes understanding of care plan and is agreeable to discharge. I have discussed return precautions with patient who verbalizes understanding of return precautions. Patient and daughter encouraged to follow-up with their PCP and neurology. All questions answered.  Discussed plan of care and discharge instructions with patient's daughter Hyui via telephone and she states understanding and is agreeable to plan of care.  [phone].  Note: Portions of this report may have been transcribed using voice recognition software. Every effort was made to ensure accuracy; however, inadvertent computerized transcription errors may still be present. Final Clinical Impressions(s) / ED Diagnoses   Final diagnoses:  Seizure [facility](HCC)    ED Discharge Orders         Ordered    divalproex (DEPAKOTE) 500 MG DR tablet  2 times daily     01/21/19 1607           Bill SalinasMorelli, Rooney Swails A, PA-C 01/21/19 1615    Bill SalinasMorelli, Bari Leib A, PA-C 01/21/19 1616    Elizabeth PalauMorelli, Andray Assefa A, PA-C 01/21/19 1628    Lorre NickAllen, Anthony, MD 01/27/19 [phone]

## 2019-01-21 NOTE — ED Notes (Signed)
Interpreter at bedside to help with assessment/ pmh

## 2019-01-22 ENCOUNTER — Encounter: Payer: Self-pay | Admitting: Adult Health

## 2020-01-12 IMAGING — CT CT HEAD CODE STROKE W/O CM
4 series · 16 of 47 positions shown, 18 images · non-contrast
Comparison: Brain MRI 07/02/2010. Head CT 09/12/2014.

CLINICAL DATA: 77-year-old male status post witnessed seizure.

EXAM:
CT HEAD WITHOUT CONTRAST
TECHNIQUE: Contiguous axial images were obtained from the base of the skull
through the vertex without intravenous contrast.

[Series 3: head 5.0 st · axial · 0.46mm/px · z∈[+1167,+1277]mm · 6 of 32 slices shown, 8 images]
[im 5/32  brain]
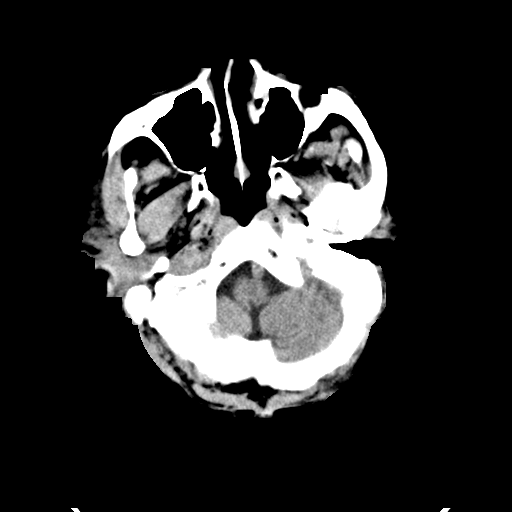
[im 5/32  bone]
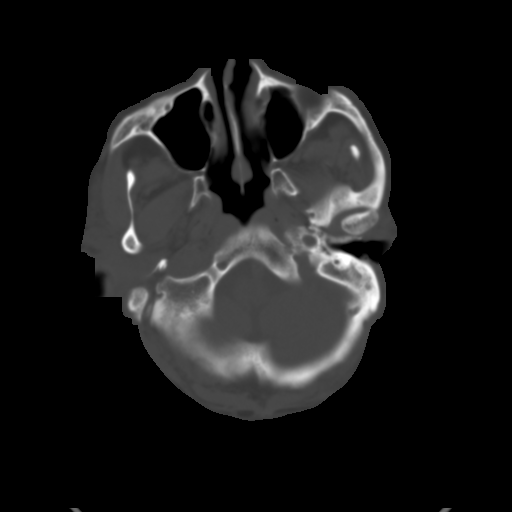
[im 9/32  brain]
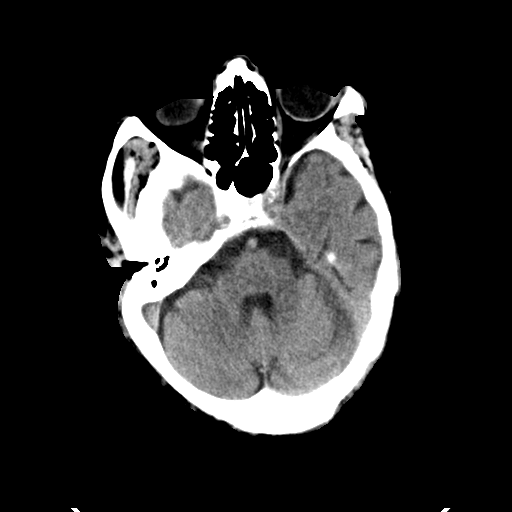
[im 14/32  brain]
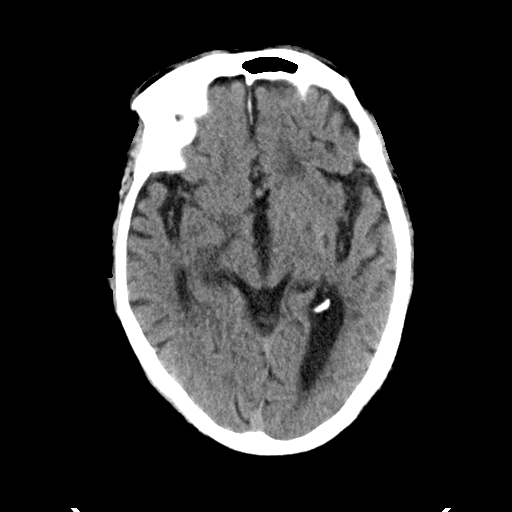
[im 18/32  brain]
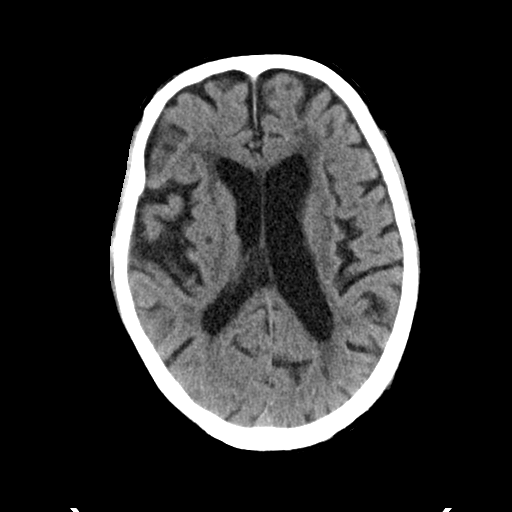
[im 23/32  brain]
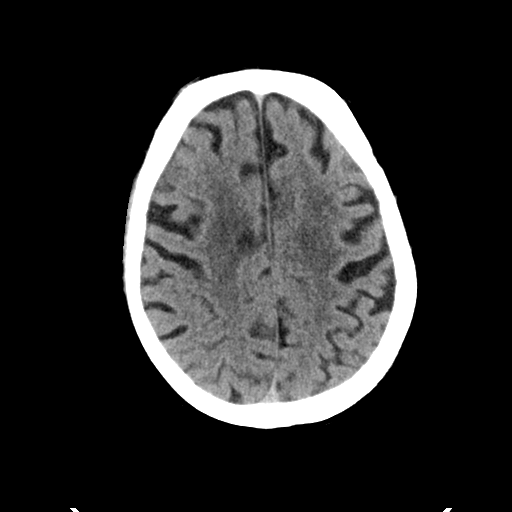
[im 23/32  bone]
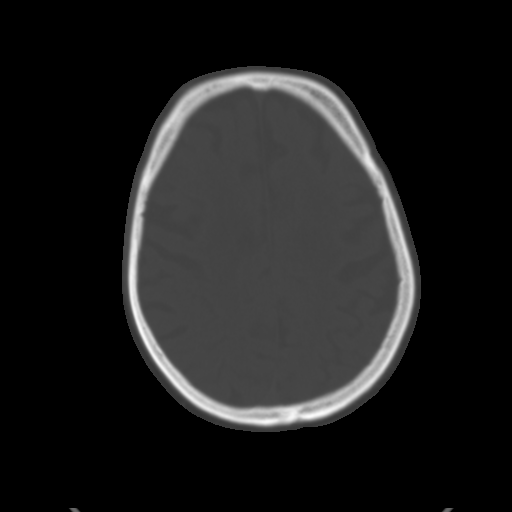
[im 27/32  brain]
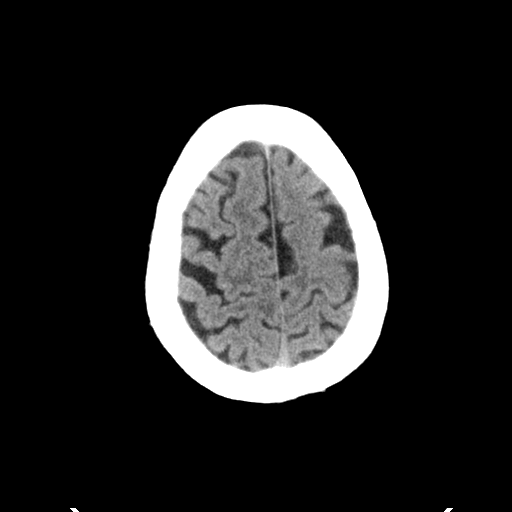

[Series 4: head 2.0 bone · axial · 0.46mm/px · z∈[+1161,+1217]mm · 4 of 83 slices shown]
[im 8/83  bone]
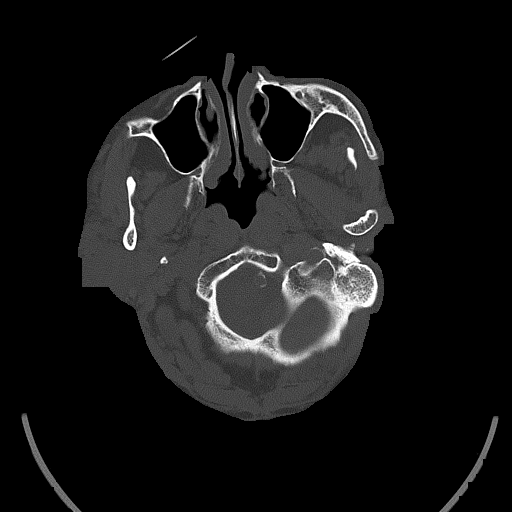
[im 16/83  bone]
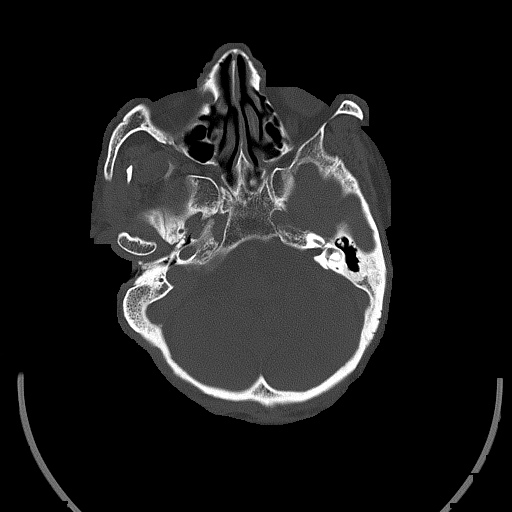
[im 28/83  bone]
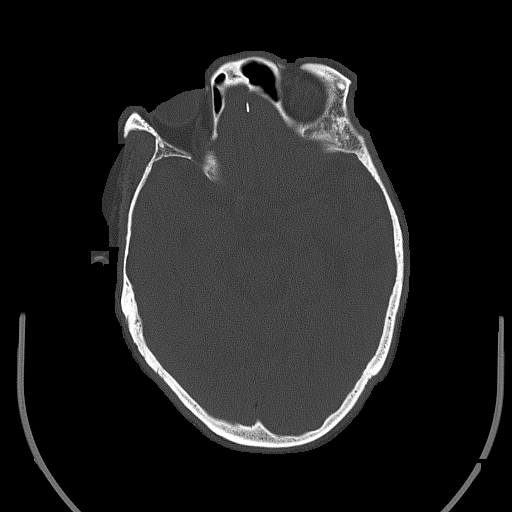
[im 36/83  bone]
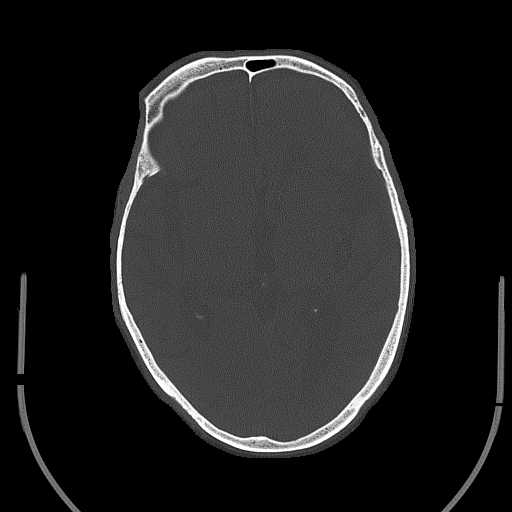

[Series 5: head 3.0 cor st · coronal · 0.34mm/px · 3 of 70 slices shown]
[im 24/70  brain]
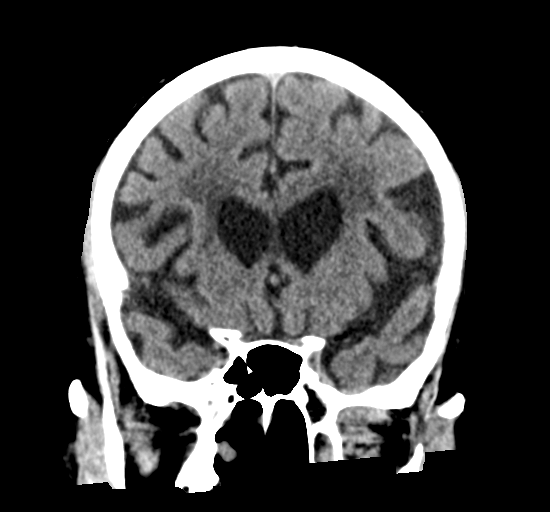
[im 31/70  brain]
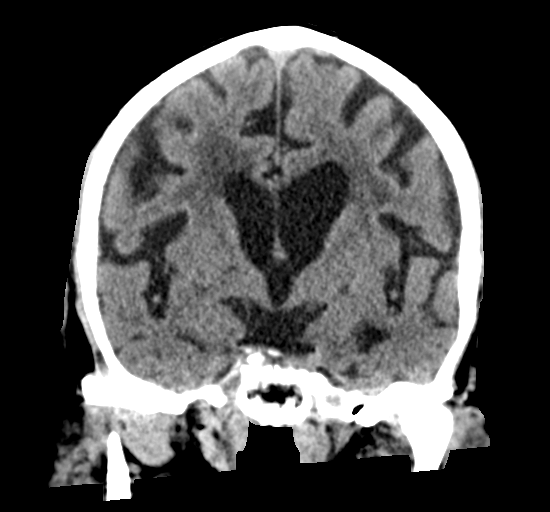
[im 39/70  brain]
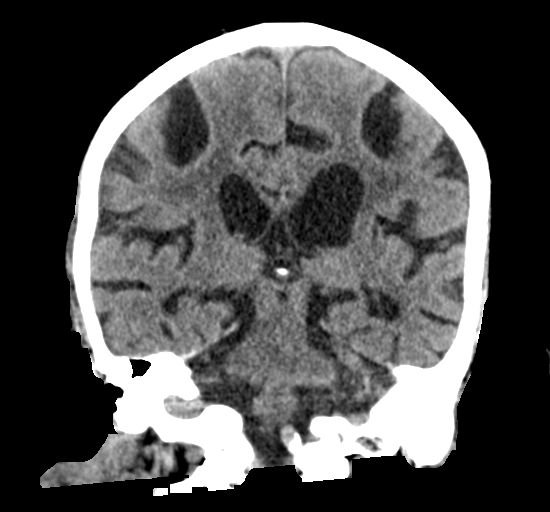

[Series 6: head 3.0 sag st · sagittal · 0.34mm/px · 3 of 59 slices shown]
[im 21/59  brain]
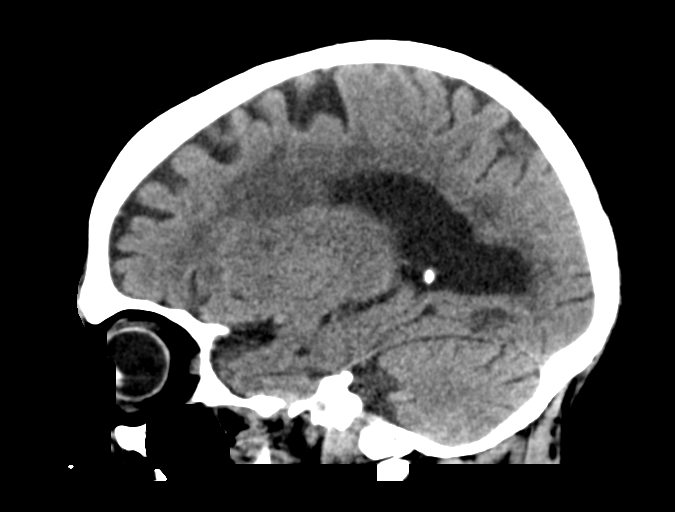
[im 30/59  brain]
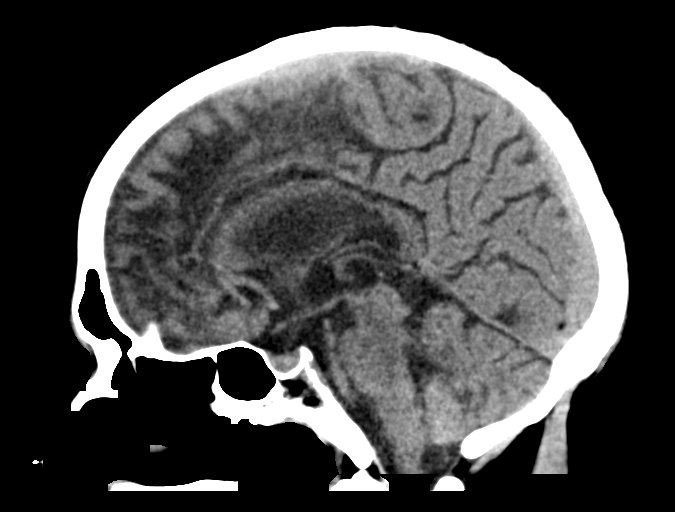
[im 39/59  brain]
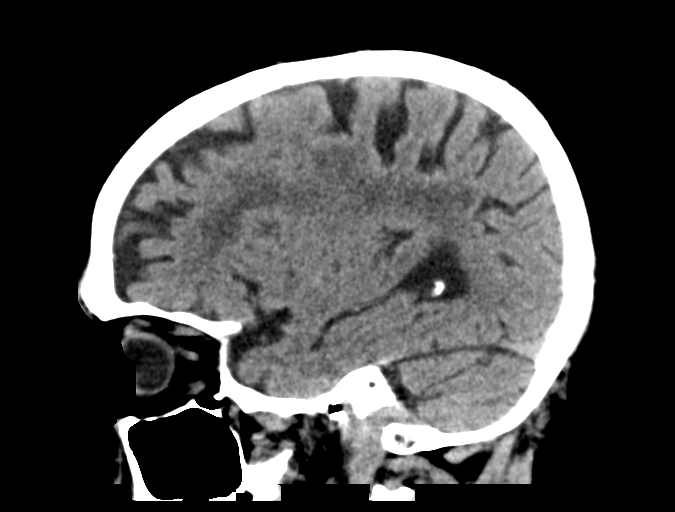

[16 of 47 positions shown; findings below may reference images not displayed]

FINDINGS: Brain: Stable cerebral volume. Stable ventricle size and
configuration. No midline shift, mass effect, or evidence of
intracranial mass lesion. No acute intracranial hemorrhage
identified.

Confluent and scattered bilateral cerebral white matter hypodensity
with associated heterogeneity in the deep gray matter nuclei appear
stable since 7870.

Stable gray-white matter differentiation throughout the brain. No
cortically based acute infarct identified.

Vascular: Calcified atherosclerosis at the skull base. Dominant left
vertebral artery. No suspicious intracranial vascular hyperdensity.

Skull: No acute osseous abnormality identified.

Sinuses/Orbits: Visualized paranasal sinuses and mastoids are stable
and well pneumatized.

Other: Visualized orbits and scalp soft tissues are within normal
limits.

ASPECTS ([place] Stroke Program Early CT Score)

Total score (0-10 with 10 being normal): 10.
IMPRESSION: 1. No acute intracranial abnormality identified.
2. Stable non contrast CT appearance of advanced chronic small
vessel disease since [DATE]. ASPECTS 10.

## 2020-08-16 ENCOUNTER — Other Ambulatory Visit: Payer: Self-pay | Admitting: Internal Medicine

## 2020-08-17 LAB — CBC
HCT: 36 % — ABNORMAL LOW (ref 38.5–50.0)
Hemoglobin: 12.4 g/dL — ABNORMAL LOW (ref 13.2–17.1)
MCH: 32 pg (ref 27.0–33.0)
MCHC: 34.4 g/dL (ref 32.0–36.0)
MCV: 92.8 fL (ref 80.0–100.0)
MPV: 9.9 fL (ref 7.5–12.5)
Platelets: 520 10*3/uL — ABNORMAL HIGH (ref 140–400)
RBC: 3.88 10*6/uL — ABNORMAL LOW (ref 4.20–5.80)
RDW: 13.3 % (ref 11.0–15.0)
WBC: 7.5 10*3/uL (ref 3.8–10.8)

## 2020-08-17 LAB — COMPLETE METABOLIC PANEL WITH GFR
AG Ratio: 0.8 (calc) — ABNORMAL LOW (ref 1.0–2.5)
ALT: 10 U/L (ref 9–46)
AST: 19 U/L (ref 10–35)
Albumin: 3.4 g/dL — ABNORMAL LOW (ref 3.6–5.1)
Alkaline phosphatase (APISO): 53 U/L (ref 35–144)
BUN: 14 mg/dL (ref 7–25)
CO2: 23 mmol/L (ref 20–32)
Calcium: 8.9 mg/dL (ref 8.6–10.3)
Chloride: 106 mmol/L (ref 98–110)
Creat: 0.99 mg/dL (ref 0.70–1.18)
GFR, Est African American: 84 mL/min/{1.73_m2} (ref >=60)
GFR, Est Non African American: 73 mL/min/{1.73_m2} (ref >=60)
Globulin: 4.2 g/dL (calc) — ABNORMAL HIGH (ref 1.9–3.7)
Glucose, Bld: 77 mg/dL (ref 65–99)
Potassium: 4.3 mmol/L (ref 3.5–5.3)
Sodium: 140 mmol/L (ref 135–146)
Total Bilirubin: 0.8 mg/dL (ref 0.2–1.2)
Total Protein: 7.6 g/dL (ref 6.1–8.1)

## 2020-08-17 LAB — TSH: TSH: 0.43 mIU/L (ref 0.40–4.50)

## 2020-08-17 LAB — LIPID PANEL
Cholesterol: 140 mg/dL (ref <200)
HDL: 31 mg/dL — ABNORMAL LOW (ref >=40)
LDL Cholesterol (Calc): 90 mg/dL (calc)
Non-HDL Cholesterol (Calc): 109 mg/dL (calc) (ref <130)
Total CHOL/HDL Ratio: 4.5 (calc) (ref <5.0)
Triglycerides: 97 mg/dL (ref <150)
